# Patient Record
Sex: Male | Born: 1945 | Race: White | Hispanic: No | Smoking: Current every day smoker
Health system: Southern US, Community
[De-identification: ages and names within clinical notes are randomized; demographics above are authoritative.]

## PROBLEM LIST (undated history)

## (undated) DIAGNOSIS — E785 Hyperlipidemia, unspecified: Secondary | ICD-10-CM

## (undated) DIAGNOSIS — I255 Ischemic cardiomyopathy: Secondary | ICD-10-CM

## (undated) DIAGNOSIS — I10 Essential (primary) hypertension: Secondary | ICD-10-CM

## (undated) DIAGNOSIS — I2510 Atherosclerotic heart disease of native coronary artery without angina pectoris: Secondary | ICD-10-CM

## (undated) DIAGNOSIS — K219 Gastro-esophageal reflux disease without esophagitis: Secondary | ICD-10-CM

## (undated) DIAGNOSIS — I472 Ventricular tachycardia: Secondary | ICD-10-CM

## (undated) DIAGNOSIS — Z8673 Personal history of transient ischemic attack (TIA), and cerebral infarction without residual deficits: Secondary | ICD-10-CM

## (undated) HISTORY — DX: Gastro-esophageal reflux disease without esophagitis: K21.9

## (undated) HISTORY — PX: HIP SURGERY: SHX245

## (undated) HISTORY — DX: Personal history of transient ischemic attack (TIA), and cerebral infarction without residual deficits: Z86.73

## (undated) HISTORY — DX: Hyperlipidemia, unspecified: E78.5

---
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
## 1993-12-31 HISTORY — PX: CORONARY ARTERY BYPASS GRAFT: SHX141

## 2013-12-31 DIAGNOSIS — I639 Cerebral infarction, unspecified: Secondary | ICD-10-CM

## 2013-12-31 HISTORY — DX: Cerebral infarction, unspecified: I63.9

## 2018-12-11 ENCOUNTER — Ambulatory Visit
Admission: RE | Admit: 2018-12-11 | Discharge: 2018-12-11 | Disposition: A | Discharge status: Home / Self Care | Payer: Medicare Other | Source: Ambulatory Visit | Attending: Nurse Practitioner | Admitting: Nurse Practitioner

## 2018-12-11 ENCOUNTER — Other Ambulatory Visit: Payer: Self-pay | Admitting: Nurse Practitioner

## 2018-12-11 DIAGNOSIS — S42415A Nondisplaced simple supracondylar fracture without intercondylar fracture of left humerus, initial encounter for closed fracture: Secondary | ICD-10-CM

## 2019-01-15 ENCOUNTER — Other Ambulatory Visit: Payer: Self-pay | Admitting: Orthopedic Surgery

## 2019-01-15 DIAGNOSIS — M25512 Pain in left shoulder: Secondary | ICD-10-CM

## 2019-01-24 ENCOUNTER — Other Ambulatory Visit: Payer: Self-pay | Admitting: Cardiology

## 2019-01-24 DIAGNOSIS — I2510 Atherosclerotic heart disease of native coronary artery without angina pectoris: Secondary | ICD-10-CM

## 2019-01-24 DIAGNOSIS — R011 Cardiac murmur, unspecified: Secondary | ICD-10-CM

## 2019-01-27 ENCOUNTER — Ambulatory Visit
Admission: RE | Admit: 2019-01-27 | Discharge: 2019-01-27 | Disposition: A | Discharge status: Home / Self Care | Payer: Medicare Other | Source: Ambulatory Visit | Attending: Orthopedic Surgery | Admitting: Orthopedic Surgery

## 2019-01-27 DIAGNOSIS — M25512 Pain in left shoulder: Secondary | ICD-10-CM

## 2019-01-27 MED ORDER — IOPAMIDOL (ISOVUE-M 200) INJECTION 41%
15.0000 mL | Freq: Once | INTRAMUSCULAR | Status: AC
  Administered 2019-01-27: 15 mL via INTRA_ARTICULAR

## 2019-02-24 ENCOUNTER — Ambulatory Visit (INDEPENDENT_AMBULATORY_CARE_PROVIDER_SITE_OTHER): Payer: Medicare Other

## 2019-02-24 DIAGNOSIS — R011 Cardiac murmur, unspecified: Secondary | ICD-10-CM | POA: Diagnosis not present

## 2019-02-24 DIAGNOSIS — I2510 Atherosclerotic heart disease of native coronary artery without angina pectoris: Secondary | ICD-10-CM | POA: Diagnosis not present

## 2019-07-13 NOTE — Progress Notes (Deleted)
Virtual Visit via Video Note   Subjective:   Anthony Richmond, male    DOB: Jul 11, 1946, 73 y.o.   MRN: [id number]   I connected with the patient on 07/15/2019 by a video enabled telemedicine application and verified that I am speaking with the correct person using two identifiers.     I discussed the limitations of evaluation and management by telemedicine and the availability of in person appointments. The patient expressed understanding and agreed to proceed.   This visit type was conducted due to national recommendations for restrictions regarding the COVID-19 Pandemic (e.g. social distancing).  This format is felt to be most appropriate for this patient at this time.  All issues noted in this document were discussed and addressed.  No physical exam was performed (except for noted visual exam findings with Tele health visits).  The patient has consented to conduct a Tele health visit and understands insurance will be billed.     Chief complaint:  Coronary artery disease  HPI  73 y/o caucasian male with coronary arery disease s/p multiple prior stents, ischemic cardiomyopathy EF 35-40%, h/o stroke in 2015 with mild residual left sided deficit, hypertension, tobacco abuse.  Workup showed ischemic cardiomyopathy with LAD territory hypikinesis, EF 35-40%, grade II diastolic dysfunction. He also has mild AS, mod MR, mild TR.      *** No past medical history on file.  *** *** The histories are not reviewed yet. Please review them in the "History" navigator section and refresh this SmartLink.  *** Social History   Socioeconomic History  . Marital status: Divorced    Spouse name: Not on file  . Number of children: Not on file  . Years of education: Not on file  . Highest education level: Not on file  Occupational History  . Not on file  Social Needs  . Financial resource strain: Not on file  . Food insecurity    Worry: Not on file    Inability: Not on file  . Transportation  needs    Medical: Not on file    Non-medical: Not on file  Tobacco Use  . Smoking status: Not on file  Substance and Sexual Activity  . Alcohol use: Not on file  . Drug use: Not on file  . Sexual activity: Not on file  Lifestyle  . Physical activity    Days per week: Not on file    Minutes per session: Not on file  . Stress: Not on file  Relationships  . Social Herbalist on phone: Not on file    Gets together: Not on file    Attends religious service: Not on file    Active member of club or organization: Not on file    Attends meetings of clubs or organizations: Not on file    Relationship status: Not on file  . Intimate partner violence    Fear of current or ex partner: Not on file    Emotionally abused: Not on file    Physically abused: Not on file    Forced sexual activity: Not on file  Other Topics Concern  . Not on file  Social History Narrative  . Not on file    *** No family history on file.  *** No current outpatient medications on file prior to visit.   No current facility-administered medications on file prior to visit.     Cardiovascular studies:  ***  Echocardiogram 02/24/2019: Left ventricle cavity is normal in  size. Mild global and severe anterior/anteroseptal hypokinesis. Visual LVEF 35-40%.  Grade II diastolic dysfunction with elevated LAP.  Left atrial cavity is mildly dilated. Mild aortic stenosis. Moderate (Grade III) mitral regurgitation. Mild tricuspid regurgitation.  No evidence of pulmonary hypertension.  Cath 2016: NSTEMI PCI to SVG-PDA for severe ISR Resolute Integrity 3.5X38 mm & 3.5X22 mm DES, post dilated to 4.0 mm  Cath 2013 (Staged PCI on 06/25 & 06/28): PCI to [place] 2.25 X 12 mm DES LM Bifurcation stenting: Xience Expedition 2.5X12 mm, 2.5 X 15 mm, and 3.5 X 8 m DES, complicated by stent dislodgement and non-deployement in left main trunk. LM Xience Expedition 4.0 X 12 mm DES jailing the dislodged  stent against legt main trunk wall. Post dilatation with 2.5 kissing balloons, and 4.5 mm balloon in left main. PCI to [place] 3.5X 12 mm DES  Recent labs: 12/11/2018: Glucose 77/ BUN/Cr 9/0.74. eGFR normal.Na/K 129/4.6 H/H 13.8/40.1. MCV 100. Platelets 194 TSH normal  ROS      *** There were no vitals filed for this visit. (Measured by the patient using a home BP monitor)  There is no height or weight on file to calculate BMI. There were no vitals filed for this visit.  *** Observation/findings during video visit   Objective:    Physical Exam        Assessment & Recommendations:   73 y/o caucasian male with coronary arery disease s/p multiple prior stents, stroke in 2015 with mild residual left sided deficit, hypertension, tobacco abuse.   Coronary artery disease: Multiple prior PCI's. Currently, no angina symptoms. Reduce aspirin to 81 mg daily. Continue plavix in light of multiple prior PCI's and no active bleeding. Continue coreg, lisinopril. Switch pravastatin to crestor 20 mg daily. Obtain prior cardiac records. Given presence of murmur and known coronary artery disease history as above, will obtain baseline echocardiogram.  Hypertension: Fairly well controlled.  H/o CVA: Mild left sided neurodeficit. Obtain prior hospital records from 2015.  Tobacco cessation counseling:  Tobacco abuse with CAD  - Currently smoking 1/2 packs/day  - Patient was informed of the dangers of tobacco abuse including stroke, cancer, and MI, as well as benefits of tobacco cessation. - Patient is is willing to quit at this time. - 5 mins were spent counseling patient cessation techniques. We discussed various methods to help quit smoking, including deciding on a date to quit, joining a support group, pharmacological agents- nicotine gum/patch/lozenges, chantix. Patient would like to quit on his own. - I will reassess his progress at his next follow-up visit   I recommended heart healthy diet that includes more of lean meat, fish, fruits, vegetables, nuts, olives (Meditarranean diet) and less of processed food, red meat, dairy, cheese, fried food, sugar, excess salt. Recommend daily exercise with at least 30 min walking or 15 min high intensity exercise most days a week.  I will see him back in 6 months.  ***   [place], MD [facility] Pager: [phone] Office: [phone] If no answer Cell [phone]

## 2019-07-15 ENCOUNTER — Encounter: Payer: Self-pay | Admitting: Cardiology

## 2019-07-15 ENCOUNTER — Ambulatory Visit: Payer: Medicare Other | Admitting: Cardiology

## 2019-07-15 DIAGNOSIS — I255 Ischemic cardiomyopathy: Secondary | ICD-10-CM

## 2019-07-15 DIAGNOSIS — I10 Essential (primary) hypertension: Secondary | ICD-10-CM

## 2019-07-15 DIAGNOSIS — I472 Ventricular tachycardia: Secondary | ICD-10-CM

## 2019-07-15 DIAGNOSIS — E785 Hyperlipidemia, unspecified: Secondary | ICD-10-CM | POA: Insufficient documentation

## 2019-07-15 DIAGNOSIS — I4729 Other ventricular tachycardia: Secondary | ICD-10-CM

## 2019-07-15 DIAGNOSIS — K219 Gastro-esophageal reflux disease without esophagitis: Secondary | ICD-10-CM | POA: Insufficient documentation

## 2019-07-15 DIAGNOSIS — I2510 Atherosclerotic heart disease of native coronary artery without angina pectoris: Secondary | ICD-10-CM

## 2019-07-15 HISTORY — DX: Essential (primary) hypertension: I10

## 2019-07-15 HISTORY — DX: Ventricular tachycardia: I47.2

## 2019-07-15 HISTORY — DX: Atherosclerotic heart disease of native coronary artery without angina pectoris: I25.10

## 2019-07-15 HISTORY — DX: Ischemic cardiomyopathy: I25.5

## 2019-07-15 HISTORY — DX: Other ventricular tachycardia: I47.29

## 2019-08-19 ENCOUNTER — Ambulatory Visit: Payer: Medicare Other | Admitting: Cardiology

## 2019-10-21 DIAGNOSIS — I2510 Atherosclerotic heart disease of native coronary artery without angina pectoris: Secondary | ICD-10-CM | POA: Insufficient documentation

## 2019-10-21 DIAGNOSIS — I5020 Unspecified systolic (congestive) heart failure: Secondary | ICD-10-CM | POA: Insufficient documentation

## 2019-10-21 NOTE — Progress Notes (Signed)
Follow up visit  Subjective:   Anthony Richmond, male    DOB: 1946-02-11, 73 y.o.   MRN: [id number]   Chief Complaint  Patient presents with  . Coronary Artery Disease     HPI  73 y/o caucasian male with coronary arery disease s/p multiple prior stents, ischemic cardiomyopathy (EF 35-40%), stroke in 2015 with mild residual left sided deficit, hypertension, tobacco abuse.  Patient is here with his son today.  He has slight limitation with physical activity. He walks with a walker. He has minimal symptoms related to shortness of breath, leg edema.   Past Medical History:  Diagnosis Date  . Benign hypertension 07/15/2019  . Coronary artery disease without angina pectoris 07/15/2019  . GERD (gastroesophageal reflux disease)   . HLD (hyperlipidemia)   . Ischemic cardiomyopathy 07/15/2019  . NSVT (nonsustained ventricular tachycardia) (HCC) 07/15/2019  . Personal history of transient ischemic attack      History reviewed. No pertinent surgical history.   Social History   Socioeconomic History  . Marital status: Divorced    Spouse name: Not on file  . Number of children: 1  . Years of education: Not on file  . Highest education level: Not on file  Occupational History  . Not on file  Social Needs  . Financial resource strain: Not on file  . Food insecurity    Worry: Not on file    Inability: Not on file  . Transportation needs    Medical: Not on file    Non-medical: Not on file  Tobacco Use  . Smoking status: Current Every Day Smoker    Types: Cigarettes    Start date: 29  . Smokeless tobacco: Never Used  . Tobacco comment: Quit 1995 but started back  Substance and Sexual Activity  . Alcohol use: Yes    Comment: occ  . Drug use: Not Currently  . Sexual activity: Not on file  Lifestyle  . Physical activity    Days per week: Not on file    Minutes per session: Not on file  . Stress: Not on file  Relationships  . Social Musician on phone: Not on  file    Gets together: Not on file    Attends religious service: Not on file    Active member of club or organization: Not on file    Attends meetings of clubs or organizations: Not on file    Relationship status: Not on file  . Intimate partner violence    Fear of current or ex partner: Not on file    Emotionally abused: Not on file    Physically abused: Not on file    Forced sexual activity: Not on file  Other Topics Concern  . Not on file  Social History Narrative  . Not on file     Family History  Problem Relation Age of Onset  . Heart attack Father      Current Outpatient Medications on File Prior to Visit  Medication Sig Dispense Refill  . aspirin EC 81 MG tablet Take 325 mg by mouth daily.     . carvedilol (COREG) 6.25 MG tablet Take 6.25 mg by mouth 2 (two) times daily with a meal.    . clopidogrel (PLAVIX) 75 MG tablet Take 75 mg by mouth daily.    . mirtazapine (REMERON) 7.5 MG tablet Take 7.5 mg by mouth at bedtime.    [name] omeprazole (PRILOSEC) 40 MG capsule Take 40 mg by  mouth daily.    . pravastatin (PRAVACHOL) 40 MG tablet Take 40 mg by mouth daily.    . tamsulosin (FLOMAX) 0.4 MG CAPS capsule Take 0.4 mg by mouth daily.     No current facility-administered medications on file prior to visit.     Cardiovascular studies:  EKG 10/22/2019:  Sinus rhythm 80 bpm. Anteroseptal infarct, old. IVCD.   ST-T changes related to IVCD.   Echocardiogram 02/24/2019: Left ventricle cavity is normal in size. Mild global and severe anterior/anteroseptal hypokinesis. Visual LVEF 35-40%.  Grade II diastolic dysfunction with elevated LAP.  Left atrial cavity is mildly dilated. Mild aortic stenosis. Moderate (Grade III) mitral regurgitation. Mild tricuspid regurgitation.  No evidence of pulmonary hypertension.  Coronary angiography 2016: NSTEMI PCI to SVG-PDA for severe ISR Resolute Integrity 3.5X38 mm & 3.5X22 mm DES, post dilated to 4.0 mm  Staged PCI on 06/25 &  06/28: PCI to [place] 2.25 X 12 mm DES LM Bifurcation stenting: Xience Expedition 2.5X12 mm, 2.5 X 15 mm, and 3.5 X 8 m DES, complicated by stent dislodgement and non-deployement in left main trunk. LM Xience Expedition 4.0 X 12 mm DES jailing the dislodged stent against legt main trunk wall. Post dilatation with 2.5 kissing balloons, and 4.5 mm balloon in left main. PCI to [place] 3.5X 12 mm DES  Recent labs: Not available   Review of Systems  Constitution: Negative for decreased appetite, malaise/fatigue, weight gain and weight loss.  HENT: Negative for congestion.   Eyes: Negative for visual disturbance.  Cardiovascular: Positive for dyspnea on exertion (Mild). Negative for chest pain, leg swelling, palpitations and syncope.  Respiratory: Positive for shortness of breath. Negative for cough.   Endocrine: Negative for cold intolerance.  Hematologic/Lymphatic: Does not bruise/bleed easily.  Skin: Negative for itching and rash.  Musculoskeletal: Negative for myalgias.  Gastrointestinal: Negative for abdominal pain, nausea and vomiting.  Genitourinary: Negative for dysuria.  Neurological: Negative for dizziness and weakness.  Psychiatric/Behavioral: The patient is not nervous/anxious.   All other systems reviewed and are negative.        Vitals:   10/22/19 1027  BP: (!) 91/59  Pulse: 80  SpO2: 100%     Body mass index is 21.59 kg/m. Filed Weights   10/22/19 1027  Weight: 142 lb (64.4 kg)     Objective:   Physical Exam        Assessment & Recommendations:   73 y/o caucasian male with coronary arery disease s/p multiple prior stents, ischemic cardiomyopathy (EF 35-40%), stroke in 2015 with mild residual left sided deficit, hypertension, tobacco abuse  HFrEF: Ischemic cardiomyopathy, EF 35-40% (01/2019), likely causing moderate MR with cardiomyopathy.  Clinically compensated, NYHA class II symptoms. Blood pressure low normal  without any lightheadedness symptoms.  Will attempt stopping lisinopril today and challenge with Entresto 24-26 mg bid at next visit. Will check BMP in a week.  Repeat echocardiogram in 02/2020.  Coronary artery disease: Multiple prior PCI's. Currently, no angina symptoms. Conitnue aspirin to 81 mg daily. Continue plavix in light of multiple prior PCI's and no active bleeding. Continue coreg 6.25 mg bid. Continue crestor 20 mg daily.  H/o CVA: Mild left sided neurodeficit.  F/u in 2 weeks.    Nigel Mormon, MD [facility] Pager: [phone] Office: [phone] If no answer Cell [phone]

## 2019-10-22 ENCOUNTER — Other Ambulatory Visit: Payer: Self-pay

## 2019-10-22 ENCOUNTER — Encounter: Payer: Self-pay | Admitting: Cardiology

## 2019-10-22 ENCOUNTER — Ambulatory Visit (INDEPENDENT_AMBULATORY_CARE_PROVIDER_SITE_OTHER): Payer: Medicare Other | Admitting: Cardiology

## 2019-10-22 VITALS — BP 91/59 | HR 80 | Ht 68.0 in | Wt 142.0 lb

## 2019-10-22 DIAGNOSIS — I5020 Unspecified systolic (congestive) heart failure: Secondary | ICD-10-CM

## 2019-10-22 DIAGNOSIS — I2510 Atherosclerotic heart disease of native coronary artery without angina pectoris: Secondary | ICD-10-CM

## 2019-11-06 ENCOUNTER — Emergency Department (HOSPITAL_COMMUNITY): Payer: Medicare Other

## 2019-11-06 ENCOUNTER — Inpatient Hospital Stay (HOSPITAL_COMMUNITY)
Admission: EM | Admit: 2019-11-06 | Discharge: 2019-11-10 | DRG: 641 | Disposition: A | Discharge status: Home Health | Payer: Medicare Other | Attending: Internal Medicine | Admitting: Internal Medicine

## 2019-11-06 ENCOUNTER — Other Ambulatory Visit: Payer: Self-pay

## 2019-11-06 ENCOUNTER — Encounter (HOSPITAL_COMMUNITY): Payer: Self-pay

## 2019-11-06 DIAGNOSIS — N1831 Chronic kidney disease, stage 3a: Secondary | ICD-10-CM | POA: Diagnosis present

## 2019-11-06 DIAGNOSIS — E162 Hypoglycemia, unspecified: Secondary | ICD-10-CM | POA: Diagnosis present

## 2019-11-06 DIAGNOSIS — N179 Acute kidney failure, unspecified: Secondary | ICD-10-CM | POA: Diagnosis present

## 2019-11-06 DIAGNOSIS — E872 Acidosis, unspecified: Secondary | ICD-10-CM

## 2019-11-06 DIAGNOSIS — A419 Sepsis, unspecified organism: Secondary | ICD-10-CM | POA: Diagnosis present

## 2019-11-06 DIAGNOSIS — I959 Hypotension, unspecified: Secondary | ICD-10-CM | POA: Diagnosis present

## 2019-11-06 DIAGNOSIS — I130 Hypertensive heart and chronic kidney disease with heart failure and stage 1 through stage 4 chronic kidney disease, or unspecified chronic kidney disease: Secondary | ICD-10-CM | POA: Diagnosis present

## 2019-11-06 DIAGNOSIS — I10 Essential (primary) hypertension: Secondary | ICD-10-CM | POA: Diagnosis present

## 2019-11-06 DIAGNOSIS — R627 Adult failure to thrive: Secondary | ICD-10-CM | POA: Diagnosis present

## 2019-11-06 DIAGNOSIS — Z8249 Family history of ischemic heart disease and other diseases of the circulatory system: Secondary | ICD-10-CM | POA: Diagnosis not present

## 2019-11-06 DIAGNOSIS — Z20828 Contact with and (suspected) exposure to other viral communicable diseases: Secondary | ICD-10-CM | POA: Diagnosis present

## 2019-11-06 DIAGNOSIS — F329 Major depressive disorder, single episode, unspecified: Secondary | ICD-10-CM | POA: Diagnosis present

## 2019-11-06 DIAGNOSIS — R680 Hypothermia, not associated with low environmental temperature: Secondary | ICD-10-CM | POA: Diagnosis present

## 2019-11-06 DIAGNOSIS — E860 Dehydration: Secondary | ICD-10-CM | POA: Diagnosis not present

## 2019-11-06 DIAGNOSIS — E785 Hyperlipidemia, unspecified: Secondary | ICD-10-CM | POA: Diagnosis present

## 2019-11-06 DIAGNOSIS — Z8673 Personal history of transient ischemic attack (TIA), and cerebral infarction without residual deficits: Secondary | ICD-10-CM

## 2019-11-06 DIAGNOSIS — Z6821 Body mass index (BMI) 21.0-21.9, adult: Secondary | ICD-10-CM | POA: Diagnosis not present

## 2019-11-06 DIAGNOSIS — I454 Nonspecific intraventricular block: Secondary | ICD-10-CM | POA: Diagnosis present

## 2019-11-06 DIAGNOSIS — E876 Hypokalemia: Secondary | ICD-10-CM | POA: Diagnosis present

## 2019-11-06 DIAGNOSIS — Z7982 Long term (current) use of aspirin: Secondary | ICD-10-CM

## 2019-11-06 DIAGNOSIS — F17200 Nicotine dependence, unspecified, uncomplicated: Secondary | ICD-10-CM | POA: Diagnosis present

## 2019-11-06 DIAGNOSIS — K219 Gastro-esophageal reflux disease without esophagitis: Secondary | ICD-10-CM | POA: Diagnosis present

## 2019-11-06 DIAGNOSIS — F17210 Nicotine dependence, cigarettes, uncomplicated: Secondary | ICD-10-CM | POA: Diagnosis present

## 2019-11-06 DIAGNOSIS — I2510 Atherosclerotic heart disease of native coronary artery without angina pectoris: Secondary | ICD-10-CM | POA: Diagnosis present

## 2019-11-06 DIAGNOSIS — Z7902 Long term (current) use of antithrombotics/antiplatelets: Secondary | ICD-10-CM

## 2019-11-06 DIAGNOSIS — I255 Ischemic cardiomyopathy: Secondary | ICD-10-CM | POA: Diagnosis present

## 2019-11-06 DIAGNOSIS — I5022 Chronic systolic (congestive) heart failure: Secondary | ICD-10-CM | POA: Diagnosis present

## 2019-11-06 DIAGNOSIS — T68XXXA Hypothermia, initial encounter: Secondary | ICD-10-CM

## 2019-11-06 DIAGNOSIS — F32A Depression, unspecified: Secondary | ICD-10-CM | POA: Diagnosis present

## 2019-11-06 DIAGNOSIS — R42 Dizziness and giddiness: Secondary | ICD-10-CM | POA: Diagnosis present

## 2019-11-06 DIAGNOSIS — I5020 Unspecified systolic (congestive) heart failure: Secondary | ICD-10-CM | POA: Diagnosis present

## 2019-11-06 DIAGNOSIS — Z79899 Other long term (current) drug therapy: Secondary | ICD-10-CM

## 2019-11-06 HISTORY — DX: Dehydration: E86.0

## 2019-11-06 LAB — URINALYSIS, ROUTINE W REFLEX MICROSCOPIC
Bilirubin Urine: NEGATIVE
Glucose, UA: 50 mg/dL — AB
Ketones, ur: 5 mg/dL — AB
Leukocytes,Ua: NEGATIVE
Nitrite: NEGATIVE
Protein, ur: 30 mg/dL — AB
Specific Gravity, Urine: 1.004 — ABNORMAL LOW (ref 1.005–1.030)
pH: 7 (ref 5.0–8.0)

## 2019-11-06 LAB — CBC WITH DIFFERENTIAL/PLATELET
Abs Immature Granulocytes: 0.05 10*3/uL (ref 0.00–0.07)
Basophils Absolute: 0 10*3/uL (ref 0.0–0.1)
Basophils Relative: 0 %
Eosinophils Absolute: 0.1 10*3/uL (ref 0.0–0.5)
Eosinophils Relative: 2 %
HCT: 43.2 % (ref 39.0–52.0)
Hemoglobin: 14.8 g/dL (ref 13.0–17.0)
Immature Granulocytes: 1 %
Lymphocytes Relative: 11 %
Lymphs Abs: 0.7 10*3/uL (ref 0.7–4.0)
MCH: 34.3 pg — ABNORMAL HIGH (ref 26.0–34.0)
MCHC: 34.3 g/dL (ref 30.0–36.0)
MCV: 100 fL (ref 80.0–100.0)
Monocytes Absolute: 0.2 10*3/uL (ref 0.1–1.0)
Monocytes Relative: 4 %
Neutro Abs: 5.5 10*3/uL (ref 1.7–7.7)
Neutrophils Relative %: 82 %
Platelets: 135 10*3/uL — ABNORMAL LOW (ref 150–400)
RBC: 4.32 MIL/uL (ref 4.22–5.81)
RDW: 11.9 % (ref 11.5–15.5)
WBC: 6.6 10*3/uL (ref 4.0–10.5)
nRBC: 0 % (ref 0.0–0.2)

## 2019-11-06 LAB — CBG MONITORING, ED
Glucose-Capillary: 168 mg/dL — ABNORMAL HIGH (ref 70–99)
Glucose-Capillary: 149 mg/dL — ABNORMAL HIGH (ref 70–99)
Glucose-Capillary: 118 mg/dL — ABNORMAL HIGH (ref 70–99)
Glucose-Capillary: 112 mg/dL — ABNORMAL HIGH (ref 70–99)

## 2019-11-06 LAB — COMPREHENSIVE METABOLIC PANEL
ALT: 19 U/L (ref 0–44)
AST: 26 U/L (ref 15–41)
Albumin: 3.7 g/dL (ref 3.5–5.0)
Alkaline Phosphatase: 111 U/L (ref 38–126)
Anion gap: 16 — ABNORMAL HIGH (ref 5–15)
BUN: 13 mg/dL (ref 8–23)
CO2: 19 mmol/L — ABNORMAL LOW (ref 22–32)
Calcium: 8.9 mg/dL (ref 8.9–10.3)
Chloride: 97 mmol/L — ABNORMAL LOW (ref 98–111)
Creatinine, Ser: 1.79 mg/dL — ABNORMAL HIGH (ref 0.61–1.24)
GFR calc Af Amer: 43 mL/min — ABNORMAL LOW (ref >60)
GFR calc non Af Amer: 37 mL/min — ABNORMAL LOW (ref >60)
Glucose, Bld: 182 mg/dL — ABNORMAL HIGH (ref 70–99)
Potassium: 3.2 mmol/L — ABNORMAL LOW (ref 3.5–5.1)
Sodium: 132 mmol/L — ABNORMAL LOW (ref 135–145)
Total Bilirubin: 1.1 mg/dL (ref 0.3–1.2)
Total Protein: 7.1 g/dL (ref 6.5–8.1)

## 2019-11-06 LAB — LACTIC ACID, PLASMA
Lactic Acid, Venous: 3.3 mmol/L (ref 0.5–1.9)
Lactic Acid, Venous: 2.5 mmol/L (ref 0.5–1.9)
Lactic Acid, Venous: 1.7 mmol/L (ref 0.5–1.9)

## 2019-11-06 LAB — APTT: aPTT: 27 seconds (ref 24–36)

## 2019-11-06 LAB — PROTIME-INR
INR: 1 (ref 0.8–1.2)
Prothrombin Time: 13.3 seconds (ref 11.4–15.2)

## 2019-11-06 LAB — SARS CORONAVIRUS 2 (TAT 6-24 HRS): SARS Coronavirus 2: NEGATIVE

## 2019-11-06 LAB — TROPONIN I (HIGH SENSITIVITY): Troponin I (High Sensitivity): 14 ng/L (ref <18)

## 2019-11-06 LAB — PROCALCITONIN: Procalcitonin: <0.1 ng/mL

## 2019-11-06 MED ORDER — ASPIRIN EC 325 MG PO TBEC
325.0000 mg | DELAYED_RELEASE_TABLET | Freq: Every day | ORAL | Status: DC
  Administered 2019-11-06 – 2019-11-10 (×5): 325 mg via ORAL
  Filled 2019-11-06 (×5): qty 1

## 2019-11-06 MED ORDER — SODIUM CHLORIDE 0.9 % IV BOLUS (SEPSIS)
1000.0000 mL | Freq: Once | INTRAVENOUS | Status: AC
  Administered 2019-11-06: 07:00:00 1000 mL via INTRAVENOUS

## 2019-11-06 MED ORDER — DOCUSATE SODIUM 100 MG PO CAPS
100.0000 mg | ORAL_CAPSULE | Freq: Two times a day (BID) | ORAL | Status: DC
  Administered 2019-11-06 – 2019-11-10 (×9): 100 mg via ORAL
  Filled 2019-11-06 (×9): qty 1

## 2019-11-06 MED ORDER — SODIUM CHLORIDE 0.9% FLUSH
3.0000 mL | Freq: Two times a day (BID) | INTRAVENOUS | Status: DC
  Administered 2019-11-06 – 2019-11-09 (×6): 3 mL via INTRAVENOUS
  Administered 2019-11-09: 03:00:00 via INTRAVENOUS
  Administered 2019-11-10: 09:00:00 3 mL via INTRAVENOUS

## 2019-11-06 MED ORDER — ACETAMINOPHEN 325 MG PO TABS: 650.0000 mg | ORAL_TABLET | Freq: Four times a day (QID) | ORAL | Status: DC | PRN

## 2019-11-06 MED ORDER — LORAZEPAM 2 MG/ML IJ SOLN: 1.0000 mg | Freq: Once | INTRAMUSCULAR | Status: DC

## 2019-11-06 MED ORDER — SODIUM CHLORIDE 0.9 % IV SOLN
INTRAVENOUS | Status: DC
  Administered 2019-11-06 – 2019-11-07 (×3): via INTRAVENOUS

## 2019-11-06 MED ORDER — ONDANSETRON HCL 4 MG/2ML IJ SOLN: 4.0000 mg | Freq: Four times a day (QID) | INTRAMUSCULAR | Status: DC | PRN

## 2019-11-06 MED ORDER — CLOPIDOGREL BISULFATE 75 MG PO TABS
75.0000 mg | ORAL_TABLET | Freq: Every day | ORAL | Status: DC
  Administered 2019-11-06 – 2019-11-10 (×5): 75 mg via ORAL
  Filled 2019-11-06 (×5): qty 1

## 2019-11-06 MED ORDER — MIRTAZAPINE 15 MG PO TABS
15.0000 mg | ORAL_TABLET | Freq: Every day | ORAL | Status: DC
  Administered 2019-11-06 – 2019-11-09 (×4): 15 mg via ORAL
  Filled 2019-11-06 (×4): qty 1

## 2019-11-06 MED ORDER — PRAVASTATIN SODIUM 40 MG PO TABS
40.0000 mg | ORAL_TABLET | Freq: Every day | ORAL | Status: DC
  Administered 2019-11-06 – 2019-11-10 (×5): 40 mg via ORAL
  Filled 2019-11-06 (×5): qty 1

## 2019-11-06 MED ORDER — TAMSULOSIN HCL 0.4 MG PO CAPS
0.4000 mg | ORAL_CAPSULE | Freq: Every day | ORAL | Status: DC
  Administered 2019-11-06 – 2019-11-10 (×5): 0.4 mg via ORAL
  Filled 2019-11-06 (×5): qty 1

## 2019-11-06 MED ORDER — CARVEDILOL 3.125 MG PO TABS
3.1250 mg | ORAL_TABLET | Freq: Two times a day (BID) | ORAL | Status: DC
  Administered 2019-11-06 – 2019-11-10 (×8): 3.125 mg via ORAL
  Filled 2019-11-06 (×10): qty 1

## 2019-11-06 MED ORDER — SODIUM CHLORIDE 0.9 % IV SOLN
INTRAVENOUS | Status: DC
  Administered 2019-11-06: 08:00:00 via INTRAVENOUS

## 2019-11-06 MED ORDER — VANCOMYCIN HCL IN DEXTROSE 1-5 GM/200ML-% IV SOLN
1000.0000 mg | Freq: Once | INTRAVENOUS | Status: AC
  Administered 2019-11-06: 07:00:00 1000 mg via INTRAVENOUS
  Filled 2019-11-06: qty 200

## 2019-11-06 MED ORDER — ACETAMINOPHEN 650 MG RE SUPP: 650.0000 mg | Freq: Four times a day (QID) | RECTAL | Status: DC | PRN

## 2019-11-06 MED ORDER — ENOXAPARIN SODIUM 40 MG/0.4ML ~~LOC~~ SOLN
40.0000 mg | SUBCUTANEOUS | Status: DC
  Administered 2019-11-06 – 2019-11-09 (×4): 40 mg via SUBCUTANEOUS
  Filled 2019-11-06 (×4): qty 0.4

## 2019-11-06 MED ORDER — SODIUM CHLORIDE 0.9 % IV SOLN
INTRAVENOUS | Status: DC
  Administered 2019-11-06: 05:00:00 via INTRAVENOUS

## 2019-11-06 MED ORDER — PANTOPRAZOLE SODIUM 40 MG PO TBEC
40.0000 mg | DELAYED_RELEASE_TABLET | Freq: Every day | ORAL | Status: DC
  Administered 2019-11-06 – 2019-11-10 (×5): 40 mg via ORAL
  Filled 2019-11-06 (×5): qty 1

## 2019-11-06 MED ORDER — SODIUM CHLORIDE 0.9 % IV SOLN
2.0000 g | Freq: Once | INTRAVENOUS | Status: AC
  Administered 2019-11-06: 06:00:00 2 g via INTRAVENOUS
  Filled 2019-11-06: qty 2

## 2019-11-06 MED ORDER — SODIUM CHLORIDE 0.9 % IV BOLUS (SEPSIS)
1000.0000 mL | Freq: Once | INTRAVENOUS | Status: AC
  Administered 2019-11-06: 05:00:00 1000 mL via INTRAVENOUS

## 2019-11-06 MED ORDER — SODIUM CHLORIDE 0.9 % IV SOLN: 2.0000 g | Freq: Two times a day (BID) | INTRAVENOUS | Status: DC

## 2019-11-06 MED ORDER — VANCOMYCIN HCL IN DEXTROSE 750-5 MG/150ML-% IV SOLN: 750.0000 mg | INTRAVENOUS | Status: DC

## 2019-11-06 MED ORDER — ONDANSETRON HCL 4 MG PO TABS: 4.0000 mg | ORAL_TABLET | Freq: Four times a day (QID) | ORAL | Status: DC | PRN

## 2019-11-06 MED ORDER — METRONIDAZOLE IN NACL 5-0.79 MG/ML-% IV SOLN
500.0000 mg | Freq: Once | INTRAVENOUS | Status: AC
  Administered 2019-11-06: 06:00:00 500 mg via INTRAVENOUS
  Filled 2019-11-06: qty 100

## 2019-11-06 NOTE — ED Notes (Signed)
hospitalist repaged per Dr. Tera Helper request

## 2019-11-06 NOTE — ED Notes (Signed)
Heart Healthy Diet was ordered for Breakfast. 

## 2019-11-06 NOTE — H&P (Signed)
History and Physical    Anthony Richmond OZH:[id number] DOB: 10/24/1946 DOA: 11/06/2019  PCP: Georgann HousekeeperHusain, Karrar, MD Consultants:  Rosemary HolmsPatwardhan - cardiology Patient coming from:  [facility]; NOK: Omer JackSon, Dirk, [phone]  Chief Complaint: dizziness  HPI: Anthony Richmond is a 73 y.o. male with medical history significant of CVA; HLD; and HTN presenting with dizziness.  He reports that his BP was previously high, but lower during the last year.  He gets dizzy and feels like the "ground's just moving on me."  This morning, he started sweating and didn't feel well.  He called his son and his son called 911.  He did not eat lunch or dinner yesterday, poor PO intake since his CVA in 2015.  No URI symptoms, normal BMs, no GI symptoms.  He got up about 2AM to go to the bathroom and couldn't get back to sleep.  He does feel better now.  He has not been eating/drinking well.  He went to visit family in [place] over the summer but returned here and has been struggling.     ED Course:  Carryover, per Dr. Loney Lohathore:  Patient presented to the hospital via EMS from his independent nursing facility with complaints of lightheadedness. Found to be hypotensive with systolic in the 60s and hypoglycemic with CBG in the 40s by EMS. Improved after IV fluid and D50. Temperature 94.8 F and sepsis work-up initiated. No leukocytosis. Lactate 3.3. UA and chest x-ray negative. Creatinine 1.7, no prior labs for comparison. Fluids and antibiotics given. Covid test pending. Bed request placed as PUI.  Review of Systems: As per HPI; otherwise review of systems reviewed and negative.   Ambulatory Status:  Ambulates with a walker  Past Medical History:  Diagnosis Date  . Benign hypertension 07/15/2019  . Coronary artery disease without angina pectoris 07/15/2019  . CVA (cerebral vascular accident) (HCC) 2015  . GERD (gastroesophageal reflux disease)   . HLD (hyperlipidemia)   . Ischemic cardiomyopathy  07/15/2019  . NSVT (nonsustained ventricular tachycardia) (HCC) 07/15/2019  . Personal history of transient ischemic attack     History reviewed. No pertinent surgical history.  Social History   Socioeconomic History  . Marital status: Divorced    Spouse name: Not on file  . Number of children: 1  . Years of education: Not on file  . Highest education level: Not on file  Occupational History  . Occupation: retired  Engineer, productionocial Needs  . Financial resource strain: Not on file  . Food insecurity    Worry: Not on file    Inability: Not on file  . Transportation needs    Medical: Not on file    Non-medical: Not on file  Tobacco Use  . Smoking status: Current Every Day Smoker    Packs/day: 0.50    Years: 50.00    Pack years: 25.00    Types: Cigarettes    Start date: 481968  . Smokeless tobacco: Never Used  . Tobacco comment: Quit 1995 but started back  Substance and Sexual Activity  . Alcohol use: Yes    Comment: 2 drinks/day  . Drug use: Not Currently  . Sexual activity: Not on file  Lifestyle  . Physical activity    Days per week: Not on file    Minutes per session: Not on file  . Stress: Not on file  Relationships  . Social Musicianconnections    Talks on phone: Not on file    Gets together: Not on file  Attends religious service: Not on file    Active member of club or organization: Not on file    Attends meetings of clubs or organizations: Not on file    Relationship status: Not on file  . Intimate partner violence    Fear of current or ex partner: Not on file    Emotionally abused: Not on file    Physically abused: Not on file    Forced sexual activity: Not on file  Other Topics Concern  . Not on file  Social History Narrative  . Not on file    No Known Allergies  Family History  Problem Relation Age of Onset  . Heart attack Father     Prior to Admission medications   Medication Sig Start Date End Date Taking? Authorizing Provider  aspirin EC 81 MG tablet Take  325 mg by mouth daily.     [provider]  carvedilol (COREG) 6.25 MG tablet Take 6.25 mg by mouth 2 (two) times daily with a meal.    [provider]  clopidogrel (PLAVIX) 75 MG tablet Take 75 mg by mouth daily.    [provider]  mirtazapine (REMERON) 7.5 MG tablet Take 7.5 mg by mouth at bedtime.    [provider]  omeprazole (PRILOSEC) 40 MG capsule Take 40 mg by mouth daily.    [provider]  pravastatin (PRAVACHOL) 40 MG tablet Take 40 mg by mouth daily.    [provider]  tamsulosin (FLOMAX) 0.4 MG CAPS capsule Take 0.4 mg by mouth daily.    [provider]    Physical Exam: Vitals:   11/06/19 0800 11/06/19 0815 11/06/19 0830 11/06/19 0845  BP: 122/60 133/76 125/79 (!) 120/59  Pulse: 86 96 (!) 104 89  Resp: 19 20 (!) 25 18  Temp:      TempSrc:      SpO2: 98% 99% 100% 99%  Weight:      Height:         . General:  Appears calm and comfortable and is NAD . Eyes:  PERRL, EOMI, normal lids, iris . ENT:  grossly normal hearing, lips & tongue, mildly dry mm; appropriate dentition . Neck:  no LAD, masses or thyromegaly . Cardiovascular:  RRR, no m/r/g. No LE edema.  [name] Respiratory:   CTA bilaterally with no wheezes/rales/rhonchi.  Normal respiratory effort. . Abdomen:  soft, NT, ND, NABS . Skin:  Scattered maculopapular lesions on arms with excoriation from itching; scattered contusions on legs . Musculoskeletal:  grossly normal tone BUE/BLE, good ROM, no bony abnormality . Psychiatric:  depressed mood and affect, speech fluent and appropriate, AOx3 . Neurologic:  CN 2-12 grossly intact, moves all extremities in coordinated fashion, sensation intact    Radiological Exams on Admission: Dg Chest Port 1 View  Result Date: 11/06/2019 CLINICAL DATA:  Hypotension. EXAM: PORTABLE CHEST 1 VIEW COMPARISON:  None. FINDINGS: There is no large pleural effusion. The heart size is normal. The patient is status post prior  median sternotomy. A loop recorder is noted. There is no pneumothorax. IMPRESSION: No active disease. Electronically Signed   By: Katherine Mantle M.D.   On: 11/06/2019 04:50    EKG: Independently reviewed.  NSR with rate 85; IVCD; nonspecific ST changes with no evidence of acute ischemia   Labs on Admission: I have personally reviewed the available labs and imaging studies at the time of the admission.  Pertinent labs:   Na++ 132 K+ 3.2 CO2 19 Glucose 182  BUN 13/Creatinine 1.79/GFR 37 Anion gap 16 HS troponin 14 WBC 6.6 Platelets 135 UA: 50 glucose, small Hgb, 5 ketones, 30 protein, rare bacteria Lactate 3.3 -> 2.5 Blood and urine cultures pending   Assessment/Plan Principal Problem:   Failure to thrive in adult Active Problems:   Benign hypertension   HLD (hyperlipidemia)   HFrEF (heart failure with reduced ejection fraction) (HCC)   Coronary artery disease involving native coronary artery of native heart without angina pectoris   Depression   Tobacco dependence   Failure to thrive -Patient presenting with dizziness, hypotension, hypothermia - initial concern for sepsis -As the history and evaluation have evolved, it appears more likely that his presentation is more c/w dehydration and FTT -Hemodynamics have improved with IVF to date -He acknowledges having nothing to eat yesterday after breakfast and also has fairly significant depression -No apparent infectious etiology at this time -For now, awaiting procalcitonin; if <0.10, will stop antibiotics -Blood and urine cultures are pending -Will continue IVF and continue to trend lactate -Will admit for ongoing evaluation and telemetry monitoring  Renal dysfunction -Uncertain baseline creatinine -Will give IVF and continue to monitor  Depression -Patient reports that he has been "going downhill" since CVA 5 years ago -His son moved him from his home in [place] to [employer] about a year ago -The patient reports  depression with passive SI -He declines psychiatry consultation at this time -Will increase Remeron dose to 30 mg qhs and continue to monitor  HTN -Previously had 1 medication stopped in October but continues on Coreg -For now will decrease Coreg dose to 3.125 mg BID - likely still needs for CVD protection, but he also had hypotension on presentation (normalized with IVF)  HLD -Continue Pravachol  CAD/HFrEF -Continue ASA/Plavix -Coreg dose decreased but medication continued, as above -No ACE due to renal dysfunction and low BPs -Continue statin  Tobacco dependence -Encourage cessation.   -This was discussed with the patient and should be reviewed on an ongoing basis.   -Patch declined.    Note: This patient has been tested and is pending for the novel coronavirus COVID-19.  DVT prophylaxis:  Lovenox  Code Status:  Full - confirmed with patient Family Communication: None present; I spoke with his son by telephone  Disposition Plan:  Home once clinically improved Consults called: None  Admission status: Admit - It is my clinical opinion that admission to INPATIENT is reasonable and necessary because of the expectation that this patient will require hospital care that crosses at least 2 midnights to treat this condition based on the medical complexity of the problems presented.  Given the aforementioned information, the predictability of an adverse outcome is felt to be significant.    Karmen Bongo MD [facility]   How to contact the [facility] Attending or Consulting provider [place] or covering provider during after hours [place], for this patient?  1. Check the care team in [facility] and look for a) attending/consulting [facility] provider listed and b) the [facility] team listed 2. Log into [email or web address] and use [place]'s universal password to access. If you do not have the password, please contact the hospital operator. 3. Locate the [facility] provider you are looking for under [facility] and page to a number that you can be directly reached. 4. If you still have difficulty reaching the provider, please page the [facility] (Director on Call) for the Hospitalists listed on amion for assistance.   11/06/2019, 9:21 AM

## 2019-11-06 NOTE — ED Provider Notes (Signed)
TIME SEEN: 4:17 AM  CHIEF COMPLAINT: Hypotension, hypoglycemia  HPI: Patient is a 73 year old male with history of hypertension, CAD, hyperlipidemia who presents to the emergency department from his independent living facility with dizziness.  Found to be hypotensive with systolic blood pressures in the 60s and a blood sugar in the 40s.  Was given IV fluids and dextrose with EMS.  Patient states he is feeling better.  No recent fevers, cough, vomiting, diarrhea, chest pain, shortness of breath.  Recently taken off of lisinopril October 22 but is still on Coreg 6.25 mg twice daily.  No other blood pressure medications.  No history of diabetes.  Son reports he has not been eating and drinking well recently.  Denies bloody stools or melena.  ROS: See HPI Constitutional: no fever  Eyes: no drainage  ENT: no runny nose   Cardiovascular:  no chest pain  Resp: no SOB  GI: no vomiting GU: no dysuria Integumentary: no rash  Allergy: no hives  Musculoskeletal: no leg swelling  Neurological: no slurred speech ROS otherwise negative  PAST MEDICAL HISTORY/PAST SURGICAL HISTORY:  Past Medical History:  Diagnosis Date  . Benign hypertension 07/15/2019  . Coronary artery disease without angina pectoris 07/15/2019  . GERD (gastroesophageal reflux disease)   . HLD (hyperlipidemia)   . Ischemic cardiomyopathy 07/15/2019  . NSVT (nonsustained ventricular tachycardia) ([facility]) 07/15/2019  . Personal history of transient ischemic attack     MEDICATIONS:  Prior to Admission medications   Medication Sig Start Date End Date Taking? Authorizing Provider  aspirin EC 81 MG tablet Take 325 mg by mouth daily.     [provider]  carvedilol (COREG) 6.25 MG tablet Take 6.25 mg by mouth 2 (two) times daily with a meal.    [provider]  clopidogrel (PLAVIX) 75 MG tablet Take 75 mg by mouth daily.    [provider]  mirtazapine (REMERON) 7.5 MG tablet Take 7.5 mg by mouth at bedtime.     [provider]  omeprazole (PRILOSEC) 40 MG capsule Take 40 mg by mouth daily.    [provider]  pravastatin (PRAVACHOL) 40 MG tablet Take 40 mg by mouth daily.    [provider]  tamsulosin (FLOMAX) 0.4 MG CAPS capsule Take 0.4 mg by mouth daily.    [provider]    ALLERGIES:  No Known Allergies  SOCIAL HISTORY:  Social History   Tobacco Use  . Smoking status: Current Every Day Smoker    Types: Cigarettes    Start date: 15  . Smokeless tobacco: Never Used  . Tobacco comment: Quit 1995 but started back  Substance Use Topics  . Alcohol use: Yes    Comment: occ    FAMILY HISTORY: Family History  Problem Relation Age of Onset  . Heart attack Father     EXAM: BP 123/75   Pulse 82   Temp (!) 94.8 F (34.9 C) (Rectal)   Resp 18   Ht 5\' 9"  (1.753 m)   Wt 63.5 kg   SpO2 100%   BMI 20.67 kg/m  CONSTITUTIONAL: Alert and oriented x4 and responds appropriately to questions.  Elderly, in no distress HEAD: Normocephalic EYES: Conjunctivae clear, pupils appear equal, EOMI ENT: normal nose; moist mucous membranes NECK: Supple, no meningismus, no nuchal rigidity, no LAD  CARD: RRR; S1 and S2 appreciated; no murmurs, no clicks, no rubs, no gallops RESP: Normal chest excursion without splinting or tachypnea; breath sounds clear and equal bilaterally; no wheezes, no  rhonchi, no rales, no hypoxia or respiratory distress, speaking full sentences ABD/GI: Normal bowel sounds; non-distended; soft, non-tender, no rebound, no guarding, no peritoneal signs, no hepatosplenomegaly BACK:  The back appears normal and is non-tender to palpation, there is no CVA tenderness EXT: Normal ROM in all joints; non-tender to palpation; no edema; normal capillary refill; no cyanosis, no calf tenderness or swelling    SKIN: Normal color for age and race; warm; no rash NEURO: Moves all extremities equally, normal speech, no facial asymmetry PSYCH: [name]  mood and manner are appropriate. Grooming and personal hygiene are appropriate.  MEDICAL DECISION MAKING: Patient here with hypotension, hypoglycemia and also found to be hypothermic.  Patient reports he has not been eating and drinking well which could be causing his hypotension and hypoglycemia but I am also concerned for possible sepsis.  Will obtain labs, cultures, urine, chest x-ray, Covid swab.  Patient under a Lawyer currently.  Will continue IV fluids and broad-spectrum antibiotics.  Will monitor temperature and blood glucose closely.  Will allow patient to eat and drink.  EKG shows no ischemic abnormality currently.  He has not having chest pain or shortness of breath.  Anticipate admission to the hospital.  Patient and son comfortable with this plan.  ED PROGRESS: Patient's labs showed a lactic acidosis.  Also has creatinine of 1.79 with no old for comparison but no known history of kidney disease.  Lactate elevated at 3.3.  Chest x-ray clear.  Urine shows no sign of infection.  Troponin negative.  Covid pending.  Lactic acidosis, hypotension, hypoglycemia may be secondary to sepsis but also may be due to dehydration.  We will continue to hydrate patient.  His blood pressures have been stable here.  I recommended admission.  His blood sugars have trended down slowly but we have not yet needed to start a D10 infusion.  Will discuss with medicine for admission for further monitoring.   6:41 AM Discussed patient's case with hospitalist, Dr. Loney Loh.  I have recommended admission and patient (and family if present) agree with this plan. Admitting physician will place admission orders.   I reviewed all nursing notes, vitals, pertinent previous records and interpreted all EKGs, lab and urine results, imaging (as available).     EKG Interpretation  Date/Time:  Friday November 06 2019 04:41:32 EST Ventricular Rate:  85 PR Interval:    QRS Duration: 131 QT Interval:  436 QTC  Calculation: 519 R Axis:   89 Text Interpretation: Sinus rhythm Nonspecific intraventricular conduction delay Anterior infarct, old Borderline ST depression, diffuse leads No old tracing to compare Confirmed by Ward, Baxter Hire [phone]) on 11/06/2019 5:06:28 AM        CRITICAL CARE Performed by: Baxter Hire Ward   Total critical care time: 54 minutes  Critical care time was exclusive of separately billable procedures and treating other patients.  Critical care was necessary to treat or prevent imminent or life-threatening deterioration.  Critical care was time spent personally by me on the following activities: development of treatment plan with patient and/or surrogate as well as nursing, discussions with consultants, evaluation of patient's response to treatment, examination of patient, obtaining history from patient or surrogate, ordering and performing treatments and interventions, ordering and review of laboratory studies, ordering and review of radiographic studies, pulse oximetry and re-evaluation of patient's condition.   Anthony Richmond was evaluated in Emergency Department on 11/06/2019 for the symptoms described in the history of present illness. He was evaluated in the context of  the global COVID-19 pandemic, which necessitated consideration that the patient might be at risk for infection with the SARS-CoV-2 virus that causes COVID-19. Institutional protocols and algorithms that pertain to the evaluation of patients at risk for COVID-19 are in a state of rapid change based on information released by regulatory bodies including the CDC and federal and state organizations. These policies and algorithms were followed during the patient's care in the ED.    Ward, Layla MawKristen N, DO 11/06/19 [phone]

## 2019-11-06 NOTE — Progress Notes (Signed)
Pharmacy Antibiotic Note  Anthony Richmond is a 73 y.o. male admitted on 11/06/2019 with rule out sepsis.  Pharmacy has been consulted for Vancomycin/Cefepime dosing. WBC WNL. Noted renal dysfunction.   Plan: Vancomycin 750 mg IV q24h >>Estimated AUC: 517 Cefepime 2g IV q12h Trend WBC, temp, renal function  F/U infectious work-up Drug levels as indicated   Height: 5\' 9"  (175.3 cm) Weight: 140 lb (63.5 kg) IBW/kg (Calculated) : 70.7  Temp (24hrs), Avg:96.1 F (35.6 C), Min:94.8 F (34.9 C), Max:97.4 F (36.3 C)  Recent Labs  Lab 11/06/19 0420  WBC 6.6  CREATININE 1.79*    Estimated Creatinine Clearance: 33.5 mL/min (A) (by C-G formula based on SCr of 1.79 mg/dL (H)).    No Known Allergies  Narda Bonds, PharmD, BCPS Clinical Pharmacist Phone: [phone]

## 2019-11-06 NOTE — ED Triage Notes (Signed)
Pt brought to ED via [facility] from [facility]. Pts son called EMS after discovering pt was weak and still hadn't eaten after 2 weeks of weakness and poor PO intake. EMS reports BP of 61/33 and CBG 46 upon arrival. Pt off lisinopril since 10/22, currently on Coreg 6.25 mg BID. Pt is currently A&O x4. Rectal temp 94.8, bair-hugger applied.

## 2019-11-06 NOTE — ED Notes (Signed)
ED TO INPATIENT HANDOFF REPORT  ED Nurse Name and Phone #:  Mickeal Skinner [phone]  S Name/Age/Gender Anthony Richmond 73 y.o. male Room/Bed: 029C/029C  Code Status   Code Status: Full Code  Home/SNF/Other Nursing Home Patient oriented to: self, place, time and situation Is this baseline? Yes   Triage Complete: Triage complete  Chief Complaint hypotension  Triage Note Pt brought to ED via [facility] from [facility]. Pts son called EMS after discovering pt was weak and still hadn't eaten after 2 weeks of weakness and poor PO intake. EMS reports BP of 61/33 and CBG 46 upon arrival. Pt off lisinopril since 10/22, currently on Coreg 6.25 mg BID. Pt is currently A&O x4. Rectal temp 94.8, bair-hugger applied.   Allergies No Known Allergies  Level of Care/Admitting Diagnosis ED Disposition    ED Disposition Condition Comment   Admit  Hospital Area: [facility] [[id number]]  Level of Care: Telemetry Medical [104]  Covid Evaluation: Asymptomatic Screening Protocol (No Symptoms)  Diagnosis: Failure to thrive in adult [358490]  Admitting Physician: John Giovanni [[id number]]  Attending Physician: John Giovanni [[id number]]  Estimated length of stay: past midnight tomorrow  Certification:: I certify this patient will need inpatient services for at least 2 midnights  PT Class (Do Not Modify): Inpatient [101]  PT Acc Code (Do Not Modify): Private [1]       B Medical/Surgery History Past Medical History:  Diagnosis Date  . Benign hypertension 07/15/2019  . Coronary artery disease without angina pectoris 07/15/2019  . CVA (cerebral vascular accident) ([facility]) 2015  . GERD (gastroesophageal reflux disease)   . HLD (hyperlipidemia)   . Ischemic cardiomyopathy 07/15/2019  . NSVT (nonsustained ventricular tachycardia) ([facility]) 07/15/2019  . Personal history of transient ischemic attack    History reviewed. No pertinent surgical history.   A IV Location/Drains/Wounds Patient  Lines/Drains/Airways Status   Active Line/Drains/Airways    Name:   Placement date:   Placement time:   Site:   Days:   Peripheral IV 11/06/19 Left Forearm   11/06/19    0400    Forearm   less than 1   Peripheral IV 11/06/19 Right Forearm   11/06/19    0400    Forearm   less than 1          Intake/Output Last 24 hours  Intake/Output Summary (Last 24 hours) at 11/06/2019 1633 Last data filed at 11/06/2019 1410 Gross per 24 hour  Intake 2820 ml  Output 400 ml  Net 2420 ml    Labs/Imaging Results for orders placed or performed during the hospital encounter of 11/06/19 (from the past 48 hour(s))  CBG monitoring, ED     Status: Abnormal   Collection Time: 11/06/19  4:18 AM  Result Value Ref Range   Glucose-Capillary 168 (H) 70 - 99 mg/dL  Comprehensive metabolic panel     Status: Abnormal   Collection Time: 11/06/19  4:20 AM  Result Value Ref Range   Sodium 132 (L) 135 - 145 mmol/L   Potassium 3.2 (L) 3.5 - 5.1 mmol/L   Chloride 97 (L) 98 - 111 mmol/L   CO2 19 (L) 22 - 32 mmol/L   Glucose, Bld 182 (H) 70 - 99 mg/dL   BUN 13 8 - 23 mg/dL   Creatinine, Ser 7.09 (H) 0.61 - 1.24 mg/dL   Calcium 8.9 8.9 - 62.8 mg/dL   Total Protein 7.1 6.5 - 8.1 g/dL   Albumin 3.7 3.5 - 5.0 g/dL   AST  26 15 - 41 U/L   ALT 19 0 - 44 U/L   Alkaline Phosphatase 111 38 - 126 U/L   Total Bilirubin 1.1 0.3 - 1.2 mg/dL   GFR calc non Af Amer 37 (L) >60 mL/min   GFR calc Af Amer 43 (L) >60 mL/min   Anion gap 16 (H) 5 - 15    Comment: Performed at [facility] [address] [place], [place] [address]  CBC WITH DIFFERENTIAL     Status: Abnormal   Collection Time: 11/06/19  4:20 AM  Result Value Ref Range   WBC 6.6 4.0 - 10.5 K/uL   RBC 4.32 4.22 - 5.81 MIL/uL   Hemoglobin 14.8 13.0 - 17.0 g/dL   HCT 43.2 39.0 - 52.0 %   MCV 100.0 80.0 - 100.0 fL   MCH 34.3 (H) 26.0 - 34.0 pg   MCHC 34.3 30.0 - 36.0 g/dL   RDW 11.9 11.5 - 15.5 %   Platelets 135 (L) 150 - 400 K/uL   nRBC 0.0 0.0 - 0.2 %    Neutrophils Relative % 82 %   Neutro Abs 5.5 1.7 - 7.7 K/uL   Lymphocytes Relative 11 %   Lymphs Abs 0.7 0.7 - 4.0 K/uL   Monocytes Relative 4 %   Monocytes Absolute 0.2 0.1 - 1.0 K/uL   Eosinophils Relative 2 %   Eosinophils Absolute 0.1 0.0 - 0.5 K/uL   Basophils Relative 0 %   Basophils Absolute 0.0 0.0 - 0.1 K/uL   Immature Granulocytes 1 %   Abs Immature Granulocytes 0.05 0.00 - 0.07 K/uL    Comment: Performed at [facility] [address], [place], [place] [address]  APTT     Status: None   Collection Time: 11/06/19  4:20 AM  Result Value Ref Range   aPTT 27 24 - 36 seconds    Comment: Performed at [facility] [address] [place], [place] [address]  Protime-INR     Status: None   Collection Time: 11/06/19  4:20 AM  Result Value Ref Range   Prothrombin Time 13.3 11.4 - 15.2 seconds   INR 1.0 0.8 - 1.2    Comment: (NOTE) INR goal varies based on device and disease states. Performed at [facility], [place] [address], [place],  [address]   Urinalysis, Routine w reflex microscopic     Status: Abnormal   Collection Time: 11/06/19  4:20 AM  Result Value Ref Range   Color, Urine STRAW (A) YELLOW   APPearance CLEAR CLEAR   Specific Gravity, Urine 1.004 (L) 1.005 - 1.030   pH 7.0 5.0 - 8.0   Glucose, UA 50 (A) NEGATIVE mg/dL   Hgb urine dipstick SMALL (A) NEGATIVE   Bilirubin Urine NEGATIVE NEGATIVE   Ketones, ur 5 (A) NEGATIVE mg/dL   Protein, ur 30 (A) NEGATIVE mg/dL   Nitrite NEGATIVE NEGATIVE   Leukocytes,Ua NEGATIVE NEGATIVE   RBC / HPF 0-5 0 - 5 RBC/hpf   WBC, UA 0-5 0 - 5 WBC/hpf   Bacteria, UA RARE (A) NONE SEEN   Squamous Epithelial / LPF 0-5 0 - 5    Comment: Performed at [facility], [place] [address], [place], [place] [address]  Troponin I (High Sensitivity)     Status: None   Collection Time: 11/06/19  4:20 AM  Result Value Ref Range   Troponin I (High Sensitivity) 14 <18 ng/L    Comment: (NOTE) Elevated high  sensitivity  troponin I (hsTnI) values and significant  changes across serial measurements may suggest ACS but many other  chronic and acute conditions are known to elevate hsTnI results.  Refer to the "Links" section for chest pain algorithms and additional  guidance. Performed at [facility], [address], [place], [place] [address]   Procalcitonin - Baseline     Status: None   Collection Time: 11/06/19  4:20 AM  Result Value Ref Range   Procalcitonin <0.10 ng/mL    Comment:        Interpretation: PCT (Procalcitonin) <= 0.5 ng/mL: Systemic infection (sepsis) is not likely. Local bacterial infection is possible. (NOTE)       Sepsis PCT Algorithm           Lower Respiratory Tract                                      Infection PCT Algorithm    ----------------------------     ----------------------------         PCT < 0.25 ng/mL                PCT < 0.10 ng/mL         Strongly encourage             Strongly discourage   discontinuation of antibiotics    initiation of antibiotics    ----------------------------     -----------------------------       PCT 0.25 - 0.50 ng/mL            PCT 0.10 - 0.25 ng/mL               OR       >80% decrease in PCT            Discourage initiation of                                            antibiotics      Encourage discontinuation           of antibiotics    ----------------------------     -----------------------------         PCT >= 0.50 ng/mL              PCT 0.26 - 0.50 ng/mL               AND        <80% decrease in PCT             Encourage initiation of                                             antibiotics       Encourage continuation           of antibiotics    ----------------------------     -----------------------------        PCT >= 0.50 ng/mL                  PCT > 0.50 ng/mL               AND         increase  in PCT                  Strongly encourage                                      initiation of antibiotics    Strongly  encourage escalation           of antibiotics                                     -----------------------------                                           PCT <= 0.25 ng/mL                                                 OR                                        > 80% decrease in PCT                                     Discontinue / Do not initiate                                             antibiotics Performed at [facility], [address], [place], [place] [address]   Lactic acid, plasma     Status: Abnormal   Collection Time: 11/06/19  5:05 AM  Result Value Ref Range   Lactic Acid, Venous 3.3 (HH) 0.5 - 1.9 mmol/L    Comment: CRITICAL RESULT CALLED TO, READ BACK BY AND VERIFIED WITH: JASPER A,RN 11/06/19 0556 WAYK Performed at [facility], [address], [place], [place] [address]   CBG monitoring, ED     Status: Abnormal   Collection Time: 11/06/19  5:11 AM  Result Value Ref Range   Glucose-Capillary 149 (H) 70 - 99 mg/dL  SARS CORONAVIRUS 2 (TAT 6-24 HRS) Nasopharyngeal Nasopharyngeal Swab     Status: None   Collection Time: 11/06/19  5:58 AM   Specimen: Nasopharyngeal Swab  Result Value Ref Range   SARS Coronavirus 2 NEGATIVE NEGATIVE    Comment: (NOTE) SARS-CoV-2 target nucleic acids are NOT DETECTED. The SARS-CoV-2 RNA is generally detectable in upper and lower respiratory specimens during the acute phase of infection. Negative results do not preclude SARS-CoV-2 infection, do not rule out co-infections with other pathogens, and should not be used as the sole basis for treatment or other patient management decisions. Negative results must be combined with clinical observations, patient history, and epidemiological information. The expected result is Negative. Fact Sheet for Patients: HairSlick.no Fact Sheet for Healthcare Providers: quierodirigir.com This test is not yet approved or cleared by the  [place] FDA  and  has been authorized for detection and/or diagnosis of SARS-CoV-2 by FDA under an Emergency Use Authorization (EUA). This EUA will remain  in effect (meaning this test can be used) for the duration of the COVID-19 declaration under Section 56 4(b)(1) of the Act, 21 U.S.C. section 360bbb-3(b)(1), unless the authorization is terminated or revoked sooner. Performed at [facility], [address] [place], [place] [address]   CBG monitoring, ED     Status: Abnormal   Collection Time: 11/06/19  6:02 AM  Result Value Ref Range   Glucose-Capillary 118 (H) 70 - 99 mg/dL  CBG monitoring, ED     Status: Abnormal   Collection Time: 11/06/19  7:03 AM  Result Value Ref Range   Glucose-Capillary 112 (H) 70 - 99 mg/dL  Lactic acid, plasma     Status: Abnormal   Collection Time: 11/06/19  7:30 AM  Result Value Ref Range   Lactic Acid, Venous 2.5 (HH) 0.5 - 1.9 mmol/L    Comment: CRITICAL VALUE NOTED.  VALUE IS CONSISTENT WITH PREVIOUSLY REPORTED AND CALLED VALUE. Performed at [facility], [address] [place], [place] [address]    Dg Chest Port 1 View  Result Date: 11/06/2019 CLINICAL DATA:  Hypotension. EXAM: PORTABLE CHEST 1 VIEW COMPARISON:  None. FINDINGS: There is no large pleural effusion. The heart size is normal. The patient is status post prior median sternotomy. A loop recorder is noted. There is no pneumothorax. IMPRESSION: No active disease. Electronically Signed   By: Katherine Mantlehristopher  Green M.D.   On: 11/06/2019 04:50    Pending Labs Unresulted Labs (From admission, onward)    Start     Ordered   11/07/19 0500  Basic metabolic panel  Tomorrow morning,   R     11/06/19 0825   11/07/19 0500  CBC  Tomorrow morning,   R     11/06/19 0825   11/07/19 0500  Procalcitonin  Daily,   R     11/06/19 0825   11/06/19 0420  Blood Culture (routine x 2)  BLOOD CULTURE X 2,   STAT     11/06/19 0420   11/06/19 0420  Urine culture  ONCE - STAT,   STAT      11/06/19 0420          Vitals/Pain Today's Vitals   11/06/19 1415 11/06/19 1430 11/06/19 1445 11/06/19 1615  BP: (!) 145/70 (!) 123/54 131/62 130/68  Pulse: 85 83 85 80  Resp: 17 13 16 18   Temp:      TempSrc:      SpO2: 100% 100% 100% 99%  Weight:      Height:      PainSc:        Isolation Precautions No active isolations  Medications Medications  vancomycin (VANCOCIN) IVPB 750 mg/150 ml premix (has no administration in time range)  ceFEPIme (MAXIPIME) 2 g in sodium chloride 0.9 % 100 mL IVPB (has no administration in time range)  aspirin EC tablet 325 mg (325 mg Oral Given 11/06/19 1045)  carvedilol (COREG) tablet 3.125 mg (3.125 mg Oral Given 11/06/19 1626)  pravastatin (PRAVACHOL) tablet 40 mg (40 mg Oral Given 11/06/19 1045)  mirtazapine (REMERON) tablet 15 mg (has no administration in time range)  pantoprazole (PROTONIX) EC tablet 40 mg (40 mg Oral Given 11/06/19 1045)  tamsulosin (FLOMAX) capsule 0.4 mg (0.4 mg Oral Given 11/06/19 1045)  clopidogrel (PLAVIX) tablet 75 mg (75 mg Oral Given 11/06/19 1045)  enoxaparin (LOVENOX)  injection 40 mg (has no administration in time range)  acetaminophen (TYLENOL) tablet 650 mg (has no administration in time range)    Or  acetaminophen (TYLENOL) suppository 650 mg (has no administration in time range)  docusate sodium (COLACE) capsule 100 mg (100 mg Oral Given 11/06/19 1045)  ondansetron (ZOFRAN) tablet 4 mg (has no administration in time range)    Or  ondansetron (ZOFRAN) injection 4 mg (has no administration in time range)  sodium chloride flush (NS) 0.9 % injection 3 mL (3 mLs Intravenous Given 11/06/19 1630)  0.9 %  sodium chloride infusion ( Intravenous New Bag/Given 11/06/19 0941)  sodium chloride 0.9 % bolus 1,000 mL (0 mLs Intravenous Stopped 11/06/19 0747)    And  sodium chloride 0.9 % bolus 1,000 mL (0 mLs Intravenous Stopped 11/06/19 0748)  ceFEPIme (MAXIPIME) 2 g in sodium chloride 0.9 % 100 mL IVPB (0 g Intravenous Stopped  11/06/19 0634)  metroNIDAZOLE (FLAGYL) IVPB 500 mg (0 mg Intravenous Stopped 11/06/19 0638)  vancomycin (VANCOCIN) IVPB 1000 mg/200 mL premix (0 mg Intravenous Stopped 11/06/19 0849)    Mobility walks with device High fall risk   Focused Assessments Neuro Assessment Handoff:  Swallow screen pass? Yes  Cardiac Rhythm: Normal sinus rhythm NIH Stroke Scale ( + Modified Stroke Scale Criteria)  Interval: Initial Level of Consciousness (1a.)   : Alert, keenly responsive LOC Questions (1b. )   +: Answers both questions correctly LOC Commands (1c. )   + : Performs both tasks correctly Best Gaze (2. )  +: Normal Visual (3. )  +: No visual loss Facial Palsy (4. )    : Normal symmetrical movements Motor Arm, Left (5a. )   +: No drift Motor Arm, Right (5b. )   +: No drift Motor Leg, Left (6a. )   +: No drift Motor Leg, Right (6b. )   +: No drift Limb Ataxia (7. ): Absent Sensory (8. )   +: Normal, no sensory loss Best Language (9. )   +: No aphasia Dysarthria (10. ): Normal Extinction/Inattention (11.)   +: No Abnormality Modified SS Total  +: 0 Complete NIHSS TOTAL: 0     Neuro Assessment: Within Defined Limits Neuro Checks:   Initial (11/06/19 1631)  Last Documented NIHSS Modified Score: 0 (11/06/19 1631) Has TPA been given? No If patient is a Neuro Trauma and patient is going to OR before floor call report to 4N Charge nurse: [phone] or [phone]     R Recommendations: See Admitting Provider Note  Report given to:   Additional Notes:

## 2019-11-06 NOTE — ED Notes (Signed)
Anthony Richmond (son), left cell # for contact - [phone]

## 2019-11-07 LAB — CBC
HCT: 38.7 % — ABNORMAL LOW (ref 39.0–52.0)
Hemoglobin: 13.5 g/dL (ref 13.0–17.0)
MCH: 34.1 pg — ABNORMAL HIGH (ref 26.0–34.0)
MCHC: 34.9 g/dL (ref 30.0–36.0)
MCV: 97.7 fL (ref 80.0–100.0)
Platelets: 137 10*3/uL — ABNORMAL LOW (ref 150–400)
RBC: 3.96 MIL/uL — ABNORMAL LOW (ref 4.22–5.81)
RDW: 11.9 % (ref 11.5–15.5)
WBC: 6.7 10*3/uL (ref 4.0–10.5)
nRBC: 0 % (ref 0.0–0.2)

## 2019-11-07 LAB — BASIC METABOLIC PANEL
Anion gap: 10 (ref 5–15)
BUN: 14 mg/dL (ref 8–23)
CO2: 21 mmol/L — ABNORMAL LOW (ref 22–32)
Calcium: 8.7 mg/dL — ABNORMAL LOW (ref 8.9–10.3)
Chloride: 105 mmol/L (ref 98–111)
Creatinine, Ser: 1.66 mg/dL — ABNORMAL HIGH (ref 0.61–1.24)
GFR calc Af Amer: 47 mL/min — ABNORMAL LOW (ref >60)
GFR calc non Af Amer: 41 mL/min — ABNORMAL LOW (ref >60)
Glucose, Bld: 84 mg/dL (ref 70–99)
Potassium: 2.3 mmol/L — CL (ref 3.5–5.1)
Sodium: 136 mmol/L (ref 135–145)

## 2019-11-07 LAB — URINE CULTURE: Culture: NO GROWTH

## 2019-11-07 LAB — PROCALCITONIN: Procalcitonin: 0.21 ng/mL

## 2019-11-07 LAB — MAGNESIUM: Magnesium: 1.5 mg/dL — ABNORMAL LOW (ref 1.7–2.4)

## 2019-11-07 MED ORDER — POTASSIUM CHLORIDE 10 MEQ/100ML IV SOLN
10.0000 meq | INTRAVENOUS | Status: AC
  Administered 2019-11-07 (×6): 10 meq via INTRAVENOUS
  Filled 2019-11-07 (×6): qty 100

## 2019-11-07 MED ORDER — ADULT MULTIVITAMIN W/MINERALS CH
1.0000 | ORAL_TABLET | Freq: Every day | ORAL | Status: DC
  Administered 2019-11-07 – 2019-11-10 (×4): 1 via ORAL
  Filled 2019-11-07 (×4): qty 1

## 2019-11-07 MED ORDER — MAGNESIUM SULFATE 2 GM/50ML IV SOLN
2.0000 g | Freq: Once | INTRAVENOUS | Status: AC
  Administered 2019-11-07: 13:00:00 2 g via INTRAVENOUS
  Filled 2019-11-07: qty 50

## 2019-11-07 MED ORDER — POTASSIUM CHLORIDE CRYS ER 20 MEQ PO TBCR
40.0000 meq | EXTENDED_RELEASE_TABLET | Freq: Once | ORAL | Status: AC
  Administered 2019-11-07: 08:00:00 40 meq via ORAL
  Filled 2019-11-07: qty 2

## 2019-11-07 MED ORDER — ENSURE ENLIVE PO LIQD
237.0000 mL | Freq: Two times a day (BID) | ORAL | Status: DC
  Administered 2019-11-07 – 2019-11-10 (×6): 237 mL via ORAL
  Filled 2019-11-07: qty 237

## 2019-11-07 NOTE — Evaluation (Signed)
Physical Therapy Evaluation Patient Details Name: Anthony Richmond MRN: [id number] DOB: 10-09-1946 Today's Date: 11/07/2019   History of Present Illness  73 y.o. male with medical history significant of CVA; HLD; and HTN presenting with dizziness, hypotension, and hypothermia. Admitted for failure to thrive and dehydration.    Clinical Impression  Pt admitted with above diagnosis. PTA pt resided in ILF at [facility]. He ambulated with rollator vs cane. On eval, he required min guard assist transfers and min guard assist ambulation 200' with RW. Pt spoke very openly about his desire to move back to [place]. He moved here after his stroke at his son's request. He reports having 2 sisters in [place] that are able to provide housing and help with his care. Pt currently with functional limitations due to the deficits listed below (see PT Problem List). Pt will benefit from skilled PT to increase their independence and safety with mobility to allow discharge to the venue listed below.       Follow Up Recommendations Home health PT;Supervision - Intermittent    Equipment Recommendations  None recommended by PT    Recommendations for Other Services       Precautions / Restrictions Precautions Precautions: Fall      Mobility  Bed Mobility Overal bed mobility: Modified Independent             General bed mobility comments: HOB elevated. +rail. Increased time.  Transfers Overall transfer level: Needs assistance Equipment used: Rolling walker (2 wheeled) Transfers: Sit to/from Stand Sit to Stand: Min guard         General transfer comment: min guard for safety. No physical assist.  Ambulation/Gait Ambulation/Gait assistance: Min guard Gait Distance (Feet): 200 Feet Assistive device: Rolling walker (2 wheeled) Gait Pattern/deviations: Step-through pattern;Decreased stride length Gait velocity: decreased Gait velocity interpretation: 1.31 - 2.62 ft/sec, indicative of limited community  ambulator General Gait Details: min guard for safety, steady gait  Stairs            Wheelchair Mobility    Modified Rankin (Stroke Patients Only)       Balance Overall balance assessment: Needs assistance Sitting-balance support: No upper extremity supported;Feet supported Sitting balance-Leahy Scale: Good     Standing balance support: No upper extremity supported;During functional activity Standing balance-Leahy Scale: Fair Standing balance comment: static stand without AD support. RW for hallway ambulation.                             Pertinent Vitals/Pain Pain Assessment: No/denies pain    Home Living Family/patient expects to be discharged to:: Private residence Living Arrangements: Alone Available Help at Discharge: Family;Available PRN/intermittently Type of Home: Independent living facility Home Access: Elevator     Home Layout: One level Home Equipment: Cane - single point;Shower seat;Walker - 4 wheels;Grab bars - toilet;Grab bars - tub/shower      Prior Function Level of Independence: Independent with assistive device(s)         Comments: amb with rollator vs cane     Hand Dominance        Extremity/Trunk Assessment   Upper Extremity Assessment Upper Extremity Assessment: Defer to OT evaluation    Lower Extremity Assessment Lower Extremity Assessment: Generalized weakness    Cervical / Trunk Assessment Cervical / Trunk Assessment: Kyphotic  Communication   Communication: No difficulties  Cognition Arousal/Alertness: Awake/alert Behavior During Therapy: WFL for tasks assessed/performed Overall Cognitive Status: Within Functional Limits for tasks  assessed                                        General Comments      Exercises     Assessment/Plan    PT Assessment Patient needs continued PT services  PT Problem List Decreased strength;Decreased mobility;Decreased activity tolerance;Decreased  balance       PT Treatment Interventions Therapeutic activities;Gait training;Therapeutic exercise;Patient/family education;Balance training;Functional mobility training    PT Goals (Current goals can be found in the Care Plan section)  Acute Rehab PT Goals Patient Stated Goal: move back to [place] PT Goal Formulation: With patient Time For Goal Achievement: 11/21/19 Potential to Achieve Goals: Good    Frequency Min 3X/week   Barriers to discharge        Co-evaluation               AM-PAC PT "6 Clicks" Mobility  Outcome Measure Help needed turning from your back to your side while in a flat bed without using bedrails?: None Help needed moving from lying on your back to sitting on the side of a flat bed without using bedrails?: A Little Help needed moving to and from a bed to a chair (including a wheelchair)?: A Little Help needed standing up from a chair using your arms (e.g., wheelchair or bedside chair)?: A Little Help needed to walk in hospital room?: A Little Help needed climbing 3-5 steps with a railing? : A Little 6 Click Score: 19    End of Session Equipment Utilized During Treatment: Gait belt Activity Tolerance: Patient tolerated treatment well Patient left: in bed;with call bell/phone within reach;Other (comment)(SLP in room) Nurse Communication: Mobility status PT Visit Diagnosis: Muscle weakness (generalized) (M62.81)    Time: 2130-8657 PT Time Calculation (min) (ACUTE ONLY): 25 min   Charges:   PT Evaluation $PT Eval Low Complexity: 1 Low PT Treatments $Gait Training: 8-22 mins        Lorrin Goodell, PT  Office # [phone] Pager [phone]   Lorriane Shire 11/07/2019, 2:58 PM

## 2019-11-07 NOTE — Progress Notes (Signed)
Initial Nutrition Assessment  DOCUMENTATION CODES:   Not applicable  INTERVENTION:  -Ensure Enlive po BID, each supplement provides 350 kcal and 20 grams of protein -Magic cup TID with meals, each supplement provides 290 kcal and 9 grams of protein -MVI with minerals daily   NUTRITION DIAGNOSIS:   Inadequate oral intake related to poor appetite(adult failure to thrive) as evidenced by per patient/family report(ongoing poor po intake s/p 2015 CVA, skipping meals at facility).  GOAL:   Patient will meet greater than or equal to 90% of their needs  MONITOR:   PO intake, Labs, I & O's, Supplement acceptance, Weight trends  REASON FOR ASSESSMENT:   Consult Assessment of nutrition requirement/status, Poor PO  ASSESSMENT:  RD working remotely.  73 year old male with past medical history significant for CVA, CAD, HLD, HTN, GERD, ischemic cardiomyopathy who presented form nursing facility via EMS with complaints of lightheadedness. Patient found to be hypotensive and hypoglycemic with CBG in the 40s by EMS; improved s/p IV fluid and D50.  Patient admitted with adult failure to thrive.  Per chart review, patient reports that he did not eat lunch or dinner the day prior to presentation. Patient reports ongoing poor PO since CVA in 2015. Patient noted feeling okay this morning and tolerating po intake, currently eating 80-100% of meals.   Limited weight history for review; noted 1 lb wt loss over the past 3 weeks. Although insignificant for time frame, concerning due to advanced age and reported history of poor po. Will provide Ensure and Magic Cup to aid with calorie/protein needs. Full assessment to follow.   Medications reviewed and include: Remeron, Protonix, Colace Mg sulfate 2g/50 ml Labs: CBGS 112-168 Mg 1.5 (L) K 2.3 (L)  NUTRITION - FOCUSED PHYSICAL EXAM: Unable to complete at this time, RD working remotely.  Diet Order:   Diet Order            Diet Heart Room service  appropriate? Yes; Fluid consistency: Thin  Diet effective now              EDUCATION NEEDS:   No education needs have been identified at this time  Skin:  Skin Assessment: Reviewed RN Assessment  Last BM:  11/6; type 2  Height:   Ht Readings from Last 1 Encounters:  11/06/19 5\' 9"  (1.753 m)    Weight:   Wt Readings from Last 1 Encounters:  11/07/19 63.7 kg    Ideal Body Weight:  72.7 kg  BMI:  Body mass index is 20.74 kg/m.  Estimated Nutritional Needs:   Kcal:  1900-2100  Protein:  76-89  Fluid:  >/= 1.9 L/day   Lajuan Lines, RD, LDN Clinical Nutrition [facility] Telephone [phone] After Hours/Weekend Pager: [phone]

## 2019-11-07 NOTE — Progress Notes (Signed)
PROGRESS NOTE    Anthony ConnJerry L Coalson  WUJ:[id number] DOB: 04/17/1946 DOA: 11/06/2019 PCP: Georgann HousekeeperHusain, Karrar, MD     Brief Narrative:  Anthony Richmond is a 73 y.o. male with medical history significant of CVA; HLD; and HTN presenting with dizziness.  He reports that his BP was previously high, but lower during the last year.  He gets dizzy and feels like the "ground's just moving on me."  This morning, he started sweating and didn't feel well.  He called his son and his son called 911.  He did not eat lunch or dinner yesterday, poor PO intake since his CVA in 2015. In the ED, he was found to be hypotensive, with SBP 60 and hypoglycemia with blood sugar 40s. Sepsis work up initiated. He was also found to have AKI with Cr 1.7.    New events last 24 hours / Subjective: Feeling okay this morning.  No new complaints.  He denies any fevers or chills at home.  No cough, nausea, vomiting, diarrhea, abdominal pain or dysuria.  He was able to eat breakfast and tolerate well.  Discussed with patient on working with PT OT today  Assessment & Plan:   Principal Problem:   Failure to thrive in adult Active Problems:   Benign hypertension   HLD (hyperlipidemia)   HFrEF (heart failure with reduced ejection fraction) (HCC)   Coronary artery disease involving native coronary artery of native heart without angina pectoris   Depression   Tobacco dependence   Failure to thrive -Patient presented with dizziness, hypotension, hypothermia - initial concern for sepsis but could be related to dehydration and FTT rather than sepsis  -No apparent infectious etiology at this time. Antibiotics stopped for now.  -Blood culture negative to date -Urine cultures pending -Covid 19 negative  -PT OT dietitian consulted   AKI -Uncertain baseline creatinine -Continue IVF  -Cr 1.79 --> 1.66   Hypokalemia -Replace, trend   Hypomagnesemia -Replace, trend  Depression -Patient reports that he has been "going downhill" since  CVA 5 years ago -His son moved [place] about a year ago -The patient reports depression without active SI -Remeron dose increased   HTN -For now will decrease Coreg dose to 3.125 mg BID - likely still needs for CVD protection, but he also had hypotension on presentation  HLD -Continue Pravachol  CAD/HFrEF -Continue ASA/Plavix, coreg, statin  -No ACE due to renal dysfunction and low BPs  Tobacco dependence -Encourage cessation   DVT prophylaxis: Lovenox Code Status: Full Family Communication: No family at bedside Disposition Plan: PT OT eval pending. Aggressive potassium replacement due to severe hypokalemia. Continue IVF.    Consultants:   None  Procedures:   None   Antimicrobials:  Anti-infectives (From admission, onward)   Start     Dose/Rate Route Frequency Ordered Stop   11/07/19 1000  vancomycin (VANCOCIN) IVPB 750 mg/150 ml premix  Status:  Discontinued     750 mg 150 mL/hr over 60 Minutes Intravenous Every 24 hours 11/06/19 0558 11/06/19 1636   11/06/19 2200  ceFEPIme (MAXIPIME) 2 g in sodium chloride 0.9 % 100 mL IVPB  Status:  Discontinued     2 g 200 mL/hr over 30 Minutes Intravenous Every 12 hours 11/06/19 0558 11/06/19 1636   11/06/19 0430  ceFEPIme (MAXIPIME) 2 g in sodium chloride 0.9 % 100 mL IVPB     2 g 200 mL/hr over 30 Minutes Intravenous  Once 11/06/19 0420 11/06/19 [id number]  11/06/19 0430  metroNIDAZOLE (FLAGYL) IVPB 500 mg     500 mg 100 mL/hr over 60 Minutes Intravenous  Once 11/06/19 0420 11/06/19 0638   11/06/19 0430  vancomycin (VANCOCIN) IVPB 1000 mg/200 mL premix     1,000 mg 200 mL/hr over 60 Minutes Intravenous  Once 11/06/19 0420 11/06/19 0849        Objective: Vitals:   11/06/19 1712 11/06/19 2139 11/07/19 0407 11/07/19 0544  BP: (!) 141/66 139/81  123/62  Pulse: 84 86  78  Resp: 17 16  16   Temp: 98.2 F (36.8 C) 98.1 F (36.7 C)  97.9 F (36.6 C)  TempSrc: Oral Oral  Oral  SpO2:  100% 100%  100%  Weight:   63.7 kg   Height:        Intake/Output Summary (Last 24 hours) at 11/07/2019 0823 Last data filed at 11/07/2019 0600 Gross per 24 hour  Intake 730 ml  Output 1600 ml  Net -870 ml   Filed Weights   11/06/19 0429 11/07/19 0407  Weight: 63.5 kg 63.7 kg    Examination:  General exam: Appears calm and comfortable  Respiratory system: Clear to auscultation. Respiratory effort normal. No respiratory distress. No conversational dyspnea.  Cardiovascular system: S1 & S2 heard, RRR. No murmurs. No pedal edema. Gastrointestinal system: Abdomen is nondistended, soft and nontender. Normal bowel sounds heard. Central nervous system: Alert and oriented. No focal neurological deficits. Speech clear.  Extremities: Symmetric in appearance  Skin: No rashes, lesions or ulcers on exposed skin  Psychiatry: Judgement and insight appear normal. Mood & affect appropriate.   Data Reviewed: I have personally reviewed following labs and imaging studies  CBC: Recent Labs  Lab 11/06/19 0420 11/07/19 0213  WBC 6.6 6.7  NEUTROABS 5.5  --   HGB 14.8 13.5  HCT 43.2 38.7*  MCV 100.0 97.7  PLT 135* 161*   Basic Metabolic Panel: Recent Labs  Lab 11/06/19 0420 11/07/19 0213  NA 132* 136  K 3.2* 2.3*  CL 97* 105  CO2 19* 21*  GLUCOSE 182* 84  BUN 13 14  CREATININE 1.79* 1.66*  CALCIUM 8.9 8.7*   GFR: Estimated Creatinine Clearance: 36.2 mL/min (A) (by C-G formula based on SCr of 1.66 mg/dL (H)). Liver Function Tests: Recent Labs  Lab 11/06/19 0420  AST 26  ALT 19  ALKPHOS 111  BILITOT 1.1  PROT 7.1  ALBUMIN 3.7   No results for input(s): LIPASE, AMYLASE in the last 168 hours. No results for input(s): AMMONIA in the last 168 hours. Coagulation Profile: Recent Labs  Lab 11/06/19 0420  INR 1.0   Cardiac Enzymes: No results for input(s): CKTOTAL, CKMB, CKMBINDEX, TROPONINI in the last 168 hours. BNP (last 3 results) No results for input(s): PROBNP in the  last 8760 hours. HbA1C: No results for input(s): HGBA1C in the last 72 hours. CBG: Recent Labs  Lab 11/06/19 0418 11/06/19 0511 11/06/19 0602 11/06/19 0703  GLUCAP 168* 149* 118* 112*   Lipid Profile: No results for input(s): CHOL, HDL, LDLCALC, TRIG, CHOLHDL, LDLDIRECT in the last 72 hours. Thyroid Function Tests: No results for input(s): TSH, T4TOTAL, FREET4, T3FREE, THYROIDAB in the last 72 hours. Anemia Panel: No results for input(s): VITAMINB12, FOLATE, FERRITIN, TIBC, IRON, RETICCTPCT in the last 72 hours. Sepsis Labs: Recent Labs  Lab 11/06/19 0420 11/06/19 0505 11/06/19 0730 11/06/19 1814 11/07/19 0213  PROCALCITON <0.10  --   --   --  0.21  LATICACIDVEN  --  3.3* 2.5* 1.7  --  Recent Results (from the past 240 hour(s))  Blood Culture (routine x 2)     Status: None (Preliminary result)   Collection Time: 11/06/19  5:00 AM   Specimen: BLOOD RIGHT FOREARM  Result Value Ref Range Status   Specimen Description BLOOD RIGHT FOREARM  Final   Special Requests   Final    BOTTLES DRAWN AEROBIC AND ANAEROBIC Blood Culture results may not be optimal due to an inadequate volume of blood received in culture bottles   Culture   Final    NO GROWTH 1 DAY Performed at [facility], [address] [place], [place] [address]    Report Status PENDING  Incomplete  Blood Culture (routine x 2)     Status: None (Preliminary result)   Collection Time: 11/06/19  5:05 AM   Specimen: BLOOD RIGHT HAND  Result Value Ref Range Status   Specimen Description BLOOD RIGHT HAND  Final   Special Requests   Final    BOTTLES DRAWN AEROBIC AND ANAEROBIC Blood Culture results may not be optimal due to an inadequate volume of blood received in culture bottles   Culture   Final    NO GROWTH 1 DAY Performed at [facility], [address] [place], [place] [address]    Report Status PENDING  Incomplete  SARS CORONAVIRUS 2 (TAT 6-24 HRS) Nasopharyngeal Nasopharyngeal Swab     Status:  None   Collection Time: 11/06/19  5:58 AM   Specimen: Nasopharyngeal Swab  Result Value Ref Range Status   SARS Coronavirus 2 NEGATIVE NEGATIVE Final    Comment: (NOTE) SARS-CoV-2 target nucleic acids are NOT DETECTED. The SARS-CoV-2 RNA is generally detectable in upper and lower respiratory specimens during the acute phase of infection. Negative results do not preclude SARS-CoV-2 infection, do not rule out co-infections with other pathogens, and should not be used as the sole basis for treatment or other patient management decisions. Negative results must be combined with clinical observations, patient history, and epidemiological information. The expected result is Negative. Fact Sheet for Patients: HairSlick.no Fact Sheet for Healthcare Providers: quierodirigir.com This test is not yet approved or cleared by the [place] FDA and  has been authorized for detection and/or diagnosis of SARS-CoV-2 by FDA under an Emergency Use Authorization (EUA). This EUA will remain  in effect (meaning this test can be used) for the duration of the COVID-19 declaration under Section 56 4(b)(1) of the Act, 21 U.S.C. section 360bbb-3(b)(1), unless the authorization is terminated or revoked sooner. Performed at [facility], [address] [place], [place] [address]       Radiology Studies: Dg Chest Port 1 View  Result Date: 11/06/2019 CLINICAL DATA:  Hypotension. EXAM: PORTABLE CHEST 1 VIEW COMPARISON:  None. FINDINGS: There is no large pleural effusion. The heart size is normal. The patient is status post prior median sternotomy. A loop recorder is noted. There is no pneumothorax. IMPRESSION: No active disease. Electronically Signed   By: Katherine Mantle M.D.   On: 11/06/2019 04:50      Scheduled Meds: . aspirin EC  325 mg Oral Daily  . carvedilol  3.125 mg Oral BID WC  . clopidogrel  75 mg Oral Daily  . docusate sodium   100 mg Oral BID  . enoxaparin (LOVENOX) injection  40 mg Subcutaneous Q24H  . mirtazapine  15 mg Oral QHS  . pantoprazole  40 mg Oral Daily  . pravastatin  40 mg Oral Daily  . sodium  chloride flush  3 mL Intravenous Q12H  . tamsulosin  0.4 mg Oral Daily   Continuous Infusions: . sodium chloride Stopped (11/06/19 1700)  . potassium chloride 10 mEq (11/07/19 0818)     LOS: 1 day      Time spent: 35 minutes   Noralee Stain, DO [facility] 11/07/2019, 8:23 AM   Available via Epic secure chat 7am-7pm After these hours, please refer to coverage provider listed on [email or web address]

## 2019-11-07 NOTE — Progress Notes (Signed)
Occupational Therapy Evaluation Patient Details Name: Anthony Richmond MRN: [id number] DOB: May 22, 1946 Today's Date: 11/07/2019    History of Present Illness 73 y.o. male with medical history significant of CVA; HLD; and HTN presenting with dizziness, hypotension, and hypothermia. Admitted for failure to thrive and dehydration.   Clinical Impression   PTA, pt was living at ILF at [facility], pt reports he was independent with ADL and had his meals provided by the facility and reports using a rollator and spc during functional mobiltiy. Pt currently requires minguard for LB ADL, toilet transfer and grooming at sink level. Pt requires minguard for functional mobility at RW level. Due to decline in current level of function, pt would benefit from acute OT to address established goals to facilitate safe D/C to venue listed below. At this time, recommend HHOT follow-up. Will continue to follow acutely.     Follow Up Recommendations  Home health OT;Supervision - Intermittent    Equipment Recommendations  None recommended by OT    Recommendations for Other Services       Precautions / Restrictions Precautions Precautions: Fall Restrictions Weight Bearing Restrictions: No      Mobility Bed Mobility Overal bed mobility: Modified Independent             General bed mobility comments: HOB elevated. +rail. Increased time.  Transfers Overall transfer level: Needs assistance Equipment used: Rolling walker (2 wheeled) Transfers: Sit to/from Stand Sit to Stand: Min guard         General transfer comment: min guard for safety. No physical assist.    Balance Overall balance assessment: Needs assistance Sitting-balance support: No upper extremity supported;Feet supported Sitting balance-Leahy Scale: Good     Standing balance support: No upper extremity supported;During functional activity Standing balance-Leahy Scale: Fair Standing balance comment: static stand without AD  support. RW for ambulation                           ADL either performed or assessed with clinical judgement   ADL Overall ADL's : Needs assistance/impaired Eating/Feeding: Set up;Sitting   Grooming: Min guard;Standing Grooming Details (indicate cue type and reason): completed ADL at sink level Upper Body Bathing: Min guard;Sitting   Lower Body Bathing: Min guard;Sit to/from stand   Upper Body Dressing : Min guard;Sitting   Lower Body Dressing: Min guard;Sit to/from stand Lower Body Dressing Details (indicate cue type and reason): able to bend over to don/doff socks and don briefs Toilet Transfer: Min guard;Ambulation Toilet Transfer Details (indicate cue type and reason): ambulated to commode  Toileting- Clothing Manipulation and Hygiene: Min guard       Functional mobility during ADLs: Min guard;Rolling walker General ADL Comments: minguard for safety and stability     Vision         Perception     Praxis      Pertinent Vitals/Pain Pain Assessment: No/denies pain     Hand Dominance     Extremity/Trunk Assessment Upper Extremity Assessment Upper Extremity Assessment: Generalized weakness   Lower Extremity Assessment Lower Extremity Assessment: Generalized weakness   Cervical / Trunk Assessment Cervical / Trunk Assessment: Kyphotic   Communication Communication Communication: No difficulties   Cognition Arousal/Alertness: Awake/alert Behavior During Therapy: WFL for tasks assessed/performed Overall Cognitive Status: Within Functional Limits for tasks assessed  General Comments  pt's grandson present during session;pt reports his daughter-in-law is an OT    Exercises     Shoulder [place] expects to be discharged to:: Private residence Living Arrangements: Alone Available Help at Discharge: Family;Available PRN/intermittently Type of Home:  Independent living facility Home Access: Elevator     Home Layout: One level     Bathroom Shower/Tub: Occupational psychologist: Handicapped height Bathroom Accessibility: Yes   Home Equipment: [place] - single point;Shower seat;Walker - 4 wheels;Grab bars - toilet;Grab bars - tub/shower          Prior Functioning/Environment Level of Independence: Independent with assistive device(s)        Comments: amb with rollator vs cane        OT Problem List: Impaired balance (sitting and/or standing);Decreased activity tolerance;Decreased safety awareness;Decreased knowledge of use of DME or AE      OT Treatment/Interventions: Self-care/ADL training;Therapeutic exercise;Energy conservation;DME and/or AE instruction;Therapeutic activities;Patient/family education;Balance training    OT Goals(Current goals can be found in the care plan section) Acute Rehab OT Goals Patient Stated Goal: move back to [place] OT Goal Formulation: With patient Time For Goal Achievement: 11/21/19 Potential to Achieve Goals: Good ADL Goals Pt Will Perform Grooming: with modified independence;standing Pt Will Perform Upper Body Dressing: with modified independence;standing Pt Will Perform Lower Body Dressing: with modified independence;sit to/from stand Pt Will Transfer to Toilet: with modified independence;ambulating Pt Will Perform Toileting - Clothing Manipulation and hygiene: with modified independence;sit to/from stand  OT Frequency: Min 2X/week   Barriers to D/C:            Co-evaluation              AM-PAC OT "6 Clicks" Daily Activity     Outcome Measure Help from another person eating meals?: A Little Help from another person taking care of personal grooming?: A Little Help from another person toileting, which includes using toliet, bedpan, or urinal?: A Little Help from another person bathing (including washing, rinsing, drying)?: A Little Help from another person to put on and  taking off regular upper body clothing?: A Little Help from another person to put on and taking off regular lower body clothing?: A Little 6 Click Score: 18   End of Session Equipment Utilized During Treatment: Gait belt;Rolling walker Nurse Communication: Mobility status  Activity Tolerance: Patient tolerated treatment well Patient left: in bed;with call bell/phone within reach;with family/visitor present;with bed alarm set  OT Visit Diagnosis: Unsteadiness on feet (R26.81);Other abnormalities of gait and mobility (R26.89);History of falling (Z91.81)                Time: 8676-7209 OT Time Calculation (min): 16 min Charges:  OT General Charges $OT Visit: 1 Visit OT Evaluation $OT Eval Moderate Complexity: [place] OTR/L Acute Rehabilitation Services Office: [place] 11/07/2019, 3:56 PM

## 2019-11-07 NOTE — Evaluation (Signed)
Clinical/Bedside Swallow Evaluation Patient Details  Name: NICKOLAOS BRALLIER MRN: [id number] Date of Birth: Jun 30, 1946  Today's Date: 11/07/2019 Time: SLP Start Time (ACUTE ONLY): 45 SLP Stop Time (ACUTE ONLY): 1428 SLP Time Calculation (min) (ACUTE ONLY): 18 min  Past Medical History:  Past Medical History:  Diagnosis Date  . Benign hypertension 07/15/2019  . Coronary artery disease without angina pectoris 07/15/2019  . CVA (cerebral vascular accident) ([place]) 2015  . Dehydration 11/06/2019  . GERD (gastroesophageal reflux disease)   . HLD (hyperlipidemia)   . Ischemic cardiomyopathy 07/15/2019  . NSVT (nonsustained ventricular tachycardia) ([place]) 07/15/2019  . Personal history of transient ischemic attack    Past Surgical History:  Past Surgical History:  Procedure Laterality Date  . CORONARY ARTERY BYPASS GRAFT  1995   [place] [facility]  . HIP SURGERY     HPI:  RALIEGH SCOBIE is a 73 y.o. male with medical history significant of CVA; HLD; and HTN presenting with dizziness.  Poor PO intake since his CVA in 2015.  In the ED, he was found to be hypotensive, with SBP 60 and hypoglycemia with blood sugar 40s. Sepsis work up initiated.   Assessment / Plan / Recommendation Clinical Impression   Pt presents with no overt s/s of aspiration at bedside with solid or liquid consistencies.  Pt does endorse a prolonged history of complaints which appear consistent with some extent of esophageal dysfunction or even simply presbyesophagus.  He has gradually stopped eating most meats because he feels they get "hung" and he has to spit them out.  Pt also endorses poor appetite since his CVA 5+ years ago with concomitant weight loss.  SLP provided skilled education regarding avoiding certain meats that "get stuck" going down and replacing them with moist, soft, or ground meats to maximize comfort with PO intake.  SLP also informed pt that he could seek follow up for an assessment of esophageal function if he or  his primary physician wishes as this could provide further insight into the nature of his symptoms.  No further acute ST needs identified at this time and I would recommend that pt remain on his current diet of regular textures and thin liquids.    SLP Visit Diagnosis: Dysphagia, pharyngoesophageal phase (R13.14)    Aspiration Risk  Mild aspiration risk    Diet Recommendation Regular;Thin liquid   Liquid Administration via: Cup;Straw Medication Administration: Whole meds with liquid Supervision: Patient able to self feed Compensations: Minimize environmental distractions;Slow rate;Small sips/bites;Follow solids with liquid Postural Changes: Seated upright at 90 degrees;Remain upright for at least 30 minutes after po intake    Other  Recommendations Recommended Consults: Consider esophageal assessment Oral Care Recommendations: Oral care BID   Follow up Recommendations None        Swallow Study   General HPI: BARRIE WALE is a 73 y.o. male with medical history significant of CVA; HLD; and HTN presenting with dizziness.  Poor PO intake since his CVA in 2015.  In the ED, he was found to be hypotensive, with SBP 60 and hypoglycemia with blood sugar 40s. Sepsis work up initiated. Type of Study: Bedside Swallow Evaluation Previous Swallow Assessment: none on record Diet Prior to this Study: Regular;Thin liquids Temperature Spikes Noted: No Respiratory Status: Room air History of Recent Intubation: No Behavior/Cognition: Alert;Cooperative;Pleasant mood Oral Care Completed by SLP: No Oral Cavity - Dentition: Adequate natural dentition Vision: Functional for self-feeding Self-Feeding Abilities: Able to feed self Patient Positioning: Upright in bed Baseline Vocal Quality:  Normal    Oral/Motor/Sensory Function Overall Oral Motor/Sensory Function: Within functional limits   Ice Chips     Thin Liquid Thin Liquid: Within functional limits    Nectar Thick     Honey Thick     Puree      Solid     Solid: Within functional limits      Dunia Pringle, Joni Reining L 11/07/2019,2:35 PM

## 2019-11-08 LAB — BASIC METABOLIC PANEL
Anion gap: 9 (ref 5–15)
BUN: 11 mg/dL (ref 8–23)
CO2: 21 mmol/L — ABNORMAL LOW (ref 22–32)
Calcium: 8.5 mg/dL — ABNORMAL LOW (ref 8.9–10.3)
Chloride: 108 mmol/L (ref 98–111)
Creatinine, Ser: 1.54 mg/dL — ABNORMAL HIGH (ref 0.61–1.24)
GFR calc Af Amer: 51 mL/min — ABNORMAL LOW (ref >60)
GFR calc non Af Amer: 44 mL/min — ABNORMAL LOW (ref >60)
Glucose, Bld: 91 mg/dL (ref 70–99)
Potassium: 2.5 mmol/L — CL (ref 3.5–5.1)
Sodium: 138 mmol/L (ref 135–145)

## 2019-11-08 LAB — CBC
HCT: 38.8 % — ABNORMAL LOW (ref 39.0–52.0)
Hemoglobin: 13.5 g/dL (ref 13.0–17.0)
MCH: 34.4 pg — ABNORMAL HIGH (ref 26.0–34.0)
MCHC: 34.8 g/dL (ref 30.0–36.0)
MCV: 98.7 fL (ref 80.0–100.0)
Platelets: 131 10*3/uL — ABNORMAL LOW (ref 150–400)
RBC: 3.93 MIL/uL — ABNORMAL LOW (ref 4.22–5.81)
RDW: 12 % (ref 11.5–15.5)
WBC: 7 10*3/uL (ref 4.0–10.5)
nRBC: 0 % (ref 0.0–0.2)

## 2019-11-08 LAB — MAGNESIUM: Magnesium: 1.6 mg/dL — ABNORMAL LOW (ref 1.7–2.4)

## 2019-11-08 LAB — PROCALCITONIN: Procalcitonin: 0.19 ng/mL

## 2019-11-08 MED ORDER — POTASSIUM CHLORIDE 20 MEQ PO PACK
40.0000 meq | PACK | Freq: Once | ORAL | Status: AC
  Administered 2019-11-08: 40 meq via ORAL
  Filled 2019-11-08: qty 2

## 2019-11-08 MED ORDER — POTASSIUM CHLORIDE 10 MEQ/100ML IV SOLN
10.0000 meq | INTRAVENOUS | Status: AC
  Administered 2019-11-08 (×6): 10 meq via INTRAVENOUS
  Filled 2019-11-08 (×6): qty 100

## 2019-11-08 MED ORDER — MAGNESIUM SULFATE 2 GM/50ML IV SOLN
2.0000 g | Freq: Once | INTRAVENOUS | Status: AC
  Administered 2019-11-08: 10:00:00 2 g via INTRAVENOUS
  Filled 2019-11-08: qty 50

## 2019-11-08 MED ORDER — MAGNESIUM SULFATE 2 GM/50ML IV SOLN
2.0000 g | Freq: Once | INTRAVENOUS | Status: AC
  Administered 2019-11-08: 05:00:00 2 g via INTRAVENOUS
  Filled 2019-11-08: qty 50

## 2019-11-08 MED ORDER — POTASSIUM CHLORIDE CRYS ER 20 MEQ PO TBCR: 40.0000 meq | EXTENDED_RELEASE_TABLET | Freq: Once | ORAL | Status: DC

## 2019-11-08 NOTE — Plan of Care (Signed)
  Problem: Coping: Goal: Ability to identify and develop effective coping behavior will improve Outcome: Progressing   Problem: Health Behavior/Discharge Planning: Goal: Identification of resources available to assist in meeting health care needs will improve Outcome: Progressing   Problem: Self-Concept: Goal: Will verbalize positive feelings about self Outcome: Progressing   Problem: Coping: Goal: Ability to identify and develop effective coping behavior will improve Outcome: Progressing

## 2019-11-08 NOTE — Progress Notes (Signed)
Son of pt brought in copies of pt's [place] as well as general Power of [employer]. Hard copies of both were added to pt's chart.

## 2019-11-08 NOTE — Plan of Care (Signed)
  Problem: Activity: Goal: Will identify at least one activity in which they can participate Outcome: Progressing   Problem: Coping: Goal: Ability to identify and develop effective coping behavior will improve Outcome: Progressing Goal: Ability to interact with others will improve Outcome: Progressing Goal: Demonstration of participation in decision-making regarding own care will improve Outcome: Progressing Goal: Ability to use eye contact when communicating with others will improve Outcome: Progressing   Problem: Health Behavior/Discharge Planning: Goal: Identification of resources available to assist in meeting health care needs will improve Outcome: Progressing   Problem: Self-Concept: Goal: Will verbalize positive feelings about self Outcome: Progressing   Problem: Activity: Goal: Will identify at least one activity in which they can participate Outcome: Progressing   Problem: Coping: Goal: Ability to identify and develop effective coping behavior will improve Outcome: Progressing Goal: Ability to interact with others will improve Outcome: Progressing Goal: Demonstration of participation in decision-making regarding own care will improve Outcome: Progressing Goal: Ability to use eye contact when communicating with others will improve Outcome: Progressing   Problem: Health Behavior/Discharge Planning: Goal: Identification of resources available to assist in meeting health care needs will improve Outcome: Progressing   Problem: Self-Concept: Goal: Will verbalize positive feelings about self Outcome: Progressing   Problem: Education: Goal: Knowledge of General Education information will improve Description: Including pain rating scale, medication(s)/side effects and non-pharmacologic comfort measures Outcome: Progressing   Problem: Health Behavior/Discharge Planning: Goal: Ability to manage health-related needs will improve Outcome: Progressing   Problem:  Clinical Measurements: Goal: Ability to maintain clinical measurements within normal limits will improve Outcome: Progressing Goal: Will remain free from infection Outcome: Progressing Goal: Diagnostic test results will improve Outcome: Progressing Goal: Respiratory complications will improve Outcome: Progressing Goal: Cardiovascular complication will be avoided Outcome: Progressing   Problem: Activity: Goal: Risk for activity intolerance will decrease Outcome: Progressing   Problem: Nutrition: Goal: Adequate nutrition will be maintained Outcome: Progressing   Problem: Coping: Goal: Level of anxiety will decrease Outcome: Progressing   Problem: Elimination: Goal: Will not experience complications related to bowel motility Outcome: Progressing Goal: Will not experience complications related to urinary retention Outcome: Progressing   Problem: Pain Managment: Goal: General experience of comfort will improve Outcome: Progressing   Problem: Safety: Goal: Ability to remain free from injury will improve Outcome: Progressing   Problem: Skin Integrity: Goal: Risk for impaired skin integrity will decrease Outcome: Progressing

## 2019-11-08 NOTE — Progress Notes (Signed)
   11/08/19 2127  MEWS Score  Resp 19  Pulse Rate 77  BP 117/63  Temp 98.4 F (36.9 C)  SpO2 98 %  O2 Device Room Air  MEWS Score  MEWS RR 0  MEWS Pulse 0  MEWS Systolic 0  MEWS LOC 0  MEWS Temp 0  MEWS Score 0  MEWS Score Color Green  MEWS Assessment  Is this an acute change? No  MEWS Guidelines - (patients age 72 and over)  Red - At High Risk for Deterioration Yellow - At risk for Deterioration  1. Go to room and assess patient 2. Validate data. Is this patient's baseline? If data confirmed: 3. Is this an acute change? 4. Administer prn meds/treatments as ordered. 5. Note Sepsis score 6. Review goals of care 7. Sports coach, RRT nurse and Provider. 8. Ask Provider to come to bedside.  9. Document patient condition/interventions/response. 10. Increase frequency of vital signs and focused assessments to at least q15 minutes x 4, then q30 minutes x2. - If stable, then q1h x3, then q4h x3 and then q8h or dept. routine. - If unstable, contact Provider & RRT nurse. Prepare for possible transfer. 11. Add entry in progress notes using the smart phrase ".MEWS". 1. Go to room and assess patient 2. Validate data. Is this patient's baseline? If data confirmed: 3. Is this an acute change? 4. Administer prn meds/treatments as ordered? 5. Note Sepsis score 6. Review goals of care 7. Sports coach and Provider 8. Call RRT nurse as needed. 9. Document patient condition/interventions/response. 10. Increase frequency of vital signs and focused assessments to at least q2h x2. - If stable, then q4h x2 and then q8h or dept. routine. - If unstable, contact Provider & RRT nurse. Prepare for possible transfer. 11. Add entry in progress notes using the smart phrase ".MEWS".  Green - Likely stable [place] - Comfort Care Only  1. Continue routine/ordered monitoring.  2. Review goals of care. 1. Continue routine/ordered monitoring. 2. Review goals of care.

## 2019-11-08 NOTE — Progress Notes (Signed)
PROGRESS NOTE    Anthony Richmond  OIZ:[id number] DOB: 25-Apr-1946 DOA: 11/06/2019 PCP: [name], MD     Brief Narrative:  Anthony Richmond is a 73 y.o. male with medical history significant of CVA; HLD; and HTN presenting with dizziness.  He reports that his BP was previously high, but lower during the last year.  He gets dizzy and feels like the "ground's just moving on me."  This morning, he started sweating and didn't feel well.  He called his son and his son called 911.  He did not eat lunch or dinner yesterday, poor PO intake since his CVA in 2015. In the ED, he was found to be hypotensive, with SBP 60 and hypoglycemia with blood sugar 40s. Sepsis work up initiated. He was also found to have AKI with Cr 1.7.    New events last 24 hours / Subjective: No complaints this morning denies any vomiting or diarrhea.  Has been tolerating meals without any issue.  Assessment & Plan:   Principal Problem:   Failure to thrive in adult Active Problems:   Benign hypertension   HLD (hyperlipidemia)   HFrEF (heart failure with reduced ejection fraction) (HCC)   Coronary artery disease involving native coronary artery of native heart without angina pectoris   Depression   Tobacco dependence    Failure to thrive -Patient presented with dizziness, hypotension, hypothermia - initial concern for sepsis but could be related to dehydration and FTT rather than sepsis  -No apparent infectious etiology at this time. Antibiotics stopped for now.  -Blood culture negative to date -Urine cultures negative  -Covid 19 negative  -PT OT dietitian consulted --> home health HT OT rec   AKI -Uncertain baseline creatinine -Cr 1.79 --> 1.66 --> 1.54   Hypokalemia -Replace, trend   Hypomagnesemia -Replace, trend  Depression -Patient reports that he has been "going downhill" since CVA 5 years ago -His son moved him from his home in [place] to [place] about a year ago -The patient reports depression  without active SI -Remeron dose increased   HTN -For now will decrease Coreg dose to 3.125 mg BID - likely still needs for CVD protection, but he also had hypotension on presentation  HLD -Continue Pravachol  CAD/HFrEF -Continue ASA/Plavix, coreg, statin  -No ACE due to renal dysfunction and low BPs  Tobacco dependence -Encourage cessation   DVT prophylaxis: Lovenox Code Status: Full Family Communication: Discussed with son over the phone  Disposition Plan:  Aggressive potassium replacement due to severe hypokalemia.    Consultants:   None  Procedures:   None   Antimicrobials:  Anti-infectives (From admission, onward)   Start     Dose/Rate Route Frequency Ordered Stop   11/07/19 1000  vancomycin (VANCOCIN) IVPB 750 mg/150 ml premix  Status:  Discontinued     750 mg 150 mL/hr over 60 Minutes Intravenous Every 24 hours 11/06/19 0558 11/06/19 1636   11/06/19 2200  ceFEPIme (MAXIPIME) 2 g in sodium chloride 0.9 % 100 mL IVPB  Status:  Discontinued     2 g 200 mL/hr over 30 Minutes Intravenous Every 12 hours 11/06/19 0558 11/06/19 1636   11/06/19 0430  ceFEPIme (MAXIPIME) 2 g in sodium chloride 0.9 % 100 mL IVPB     2 g 200 mL/hr over 30 Minutes Intravenous  Once 11/06/19 0420 11/06/19 0634   11/06/19 0430  metroNIDAZOLE (FLAGYL) IVPB 500 mg     500 mg 100 mL/hr over 60 Minutes Intravenous  Once 11/06/19  5427 11/06/19 0638   11/06/19 0430  vancomycin (VANCOCIN) IVPB 1000 mg/200 mL premix     1,000 mg 200 mL/hr over 60 Minutes Intravenous  Once 11/06/19 0420 11/06/19 0849       Objective: Vitals:   11/07/19 0407 11/07/19 0544 11/07/19 2034 11/08/19 0609  BP:  123/62 134/74 132/62  Pulse:  78 79 77  Resp:  16 20 20   Temp:  97.9 F (36.6 C) 98.5 F (36.9 C) 97.8 F (36.6 C)  TempSrc:  Oral Oral Oral  SpO2:  100% 100% 99%  Weight: 63.7 kg     Height:        Intake/Output Summary (Last 24 hours) at 11/08/2019 1038 Last data filed at 11/08/2019 0830 Gross  per 24 hour  Intake 1935.23 ml  Output 1550 ml  Net 385.23 ml   Filed Weights   11/06/19 0429 11/07/19 0407  Weight: 63.5 kg 63.7 kg    Examination: General exam: Appears calm and comfortable  Respiratory system: Clear to auscultation. Respiratory effort normal. Cardiovascular system: S1 & S2 heard, RRR. No pedal edema. Gastrointestinal system: Abdomen is nondistended, soft and nontender. Normal bowel sounds heard. Central nervous system: Alert and oriented. Non focal exam. Speech clear  Extremities: Symmetric in appearance bilaterally  Skin: No rashes, lesions or ulcers on exposed skin  Psychiatry: Judgement and insight appear stable. Mood & affect appropriate.    Data Reviewed: I have personally reviewed following labs and imaging studies  CBC: Recent Labs  Lab 11/06/19 0420 11/07/19 0213 11/08/19 0319  WBC 6.6 6.7 7.0  NEUTROABS 5.5  --   --   HGB 14.8 13.5 13.5  HCT 43.2 38.7* 38.8*  MCV 100.0 97.7 98.7  PLT 135* 137* 062*   Basic Metabolic Panel: Recent Labs  Lab 11/06/19 0420 11/07/19 0213 11/07/19 0952 11/08/19 0319  NA 132* 136  --  138  K 3.2* 2.3*  --  2.5*  CL 97* 105  --  108  CO2 19* 21*  --  21*  GLUCOSE 182* 84  --  91  BUN 13 14  --  11  CREATININE 1.79* 1.66*  --  1.54*  CALCIUM 8.9 8.7*  --  8.5*  MG  --   --  1.5* 1.6*   GFR: Estimated Creatinine Clearance: 39.1 mL/min (A) (by C-G formula based on SCr of 1.54 mg/dL (H)). Liver Function Tests: Recent Labs  Lab 11/06/19 0420  AST 26  ALT 19  ALKPHOS 111  BILITOT 1.1  PROT 7.1  ALBUMIN 3.7   No results for input(s): LIPASE, AMYLASE in the last 168 hours. No results for input(s): AMMONIA in the last 168 hours. Coagulation Profile: Recent Labs  Lab 11/06/19 0420  INR 1.0   Cardiac Enzymes: No results for input(s): CKTOTAL, CKMB, CKMBINDEX, TROPONINI in the last 168 hours. BNP (last 3 results) No results for input(s): PROBNP in the last 8760 hours. HbA1C: No results for  input(s): HGBA1C in the last 72 hours. CBG: Recent Labs  Lab 11/06/19 0418 11/06/19 0511 11/06/19 0602 11/06/19 0703  GLUCAP 168* 149* 118* 112*   Lipid Profile: No results for input(s): CHOL, HDL, LDLCALC, TRIG, CHOLHDL, LDLDIRECT in the last 72 hours. Thyroid Function Tests: No results for input(s): TSH, T4TOTAL, FREET4, T3FREE, THYROIDAB in the last 72 hours. Anemia Panel: No results for input(s): VITAMINB12, FOLATE, FERRITIN, TIBC, IRON, RETICCTPCT in the last 72 hours. Sepsis Labs: Recent Labs  Lab 11/06/19 0420 11/06/19 0505 11/06/19 0730 11/06/19 1814 11/07/19 3762  11/08/19 0319  PROCALCITON <0.10  --   --   --  0.21 0.19  LATICACIDVEN  --  3.3* 2.5* 1.7  --   --     Recent Results (from the past 240 hour(s))  Blood Culture (routine x 2)     Status: None (Preliminary result)   Collection Time: 11/06/19  5:00 AM   Specimen: BLOOD RIGHT FOREARM  Result Value Ref Range Status   Specimen Description BLOOD RIGHT FOREARM  Final   Special Requests   Final    BOTTLES DRAWN AEROBIC AND ANAEROBIC Blood Culture results may not be optimal due to an inadequate volume of blood received in culture bottles   Culture   Final    NO GROWTH 2 DAYS Performed at [facility], [address], [place], [place] [address]    Report Status PENDING  Incomplete  Blood Culture (routine x 2)     Status: None (Preliminary result)   Collection Time: 11/06/19  5:05 AM   Specimen: BLOOD RIGHT HAND  Result Value Ref Range Status   Specimen Description BLOOD RIGHT HAND  Final   Special Requests   Final    BOTTLES DRAWN AEROBIC AND ANAEROBIC Blood Culture results may not be optimal due to an inadequate volume of blood received in culture bottles   Culture   Final    NO GROWTH 2 DAYS Performed at [facility], [address] [place], [place] [address]    Report Status PENDING  Incomplete  Urine culture     Status: None   Collection Time: 11/06/19  5:52 AM   Specimen: Urine,  Random  Result Value Ref Range Status   Specimen Description URINE, RANDOM  Final   Special Requests NONE  Final   Culture   Final    NO GROWTH Performed at [facility], [address], [place], [place] [address]    Report Status 11/07/2019 FINAL  Final  SARS CORONAVIRUS 2 (TAT 6-24 HRS) Nasopharyngeal Nasopharyngeal Swab     Status: None   Collection Time: 11/06/19  5:58 AM   Specimen: Nasopharyngeal Swab  Result Value Ref Range Status   SARS Coronavirus 2 NEGATIVE NEGATIVE Final    Comment: (NOTE) SARS-CoV-2 target nucleic acids are NOT DETECTED. The SARS-CoV-2 RNA is generally detectable in upper and lower respiratory specimens during the acute phase of infection. Negative results do not preclude SARS-CoV-2 infection, do not rule out co-infections with other pathogens, and should not be used as the sole basis for treatment or other patient management decisions. Negative results must be combined with clinical observations, patient history, and epidemiological information. The expected result is Negative. Fact Sheet for Patients: HairSlick.no Fact Sheet for Healthcare Providers: quierodirigir.com This test is not yet approved or cleared by the [place] FDA and  has been authorized for detection and/or diagnosis of SARS-CoV-2 by FDA under an Emergency Use Authorization (EUA). This EUA will remain  in effect (meaning this test can be used) for the duration of the COVID-19 declaration under Section 56 4(b)(1) of the Act, 21 U.S.C. section 360bbb-3(b)(1), unless the authorization is terminated or revoked sooner. Performed at [facility], [address] [place], [place] [address]       Radiology Studies: No results found.    Scheduled Meds: . aspirin EC  325 mg Oral Daily  . carvedilol  3.125 mg Oral BID WC  . clopidogrel  75 mg Oral Daily  . docusate sodium  100 mg Oral BID  . enoxaparin  (LOVENOX) injection  40 mg Subcutaneous Q24H  . feeding supplement (ENSURE ENLIVE)  237 mL Oral BID BM  . mirtazapine  15 mg Oral QHS  . multivitamin with minerals  1 tablet Oral Daily  . pantoprazole  40 mg Oral Daily  . pravastatin  40 mg Oral Daily  . sodium chloride flush  3 mL Intravenous Q12H  . tamsulosin  0.4 mg Oral Daily   Continuous Infusions: . sodium chloride 100 mL/hr at 11/07/19 2139  . potassium chloride 10 mEq (11/08/19 0934)     LOS: 2 days      Time spent: 25 minutes   Noralee StainJennifer Leelah Hanna, DO [facility] 11/08/2019, 10:38 AM   Available via Epic secure chat 7am-7pm After these hours, please refer to coverage provider listed on [email or web address]

## 2019-11-09 LAB — BASIC METABOLIC PANEL
Anion gap: 8 (ref 5–15)
BUN: 9 mg/dL (ref 8–23)
CO2: 22 mmol/L (ref 22–32)
Calcium: 8.6 mg/dL — ABNORMAL LOW (ref 8.9–10.3)
Chloride: 105 mmol/L (ref 98–111)
Creatinine, Ser: 1.51 mg/dL — ABNORMAL HIGH (ref 0.61–1.24)
GFR calc Af Amer: 53 mL/min — ABNORMAL LOW (ref >60)
GFR calc non Af Amer: 45 mL/min — ABNORMAL LOW (ref >60)
Glucose, Bld: 91 mg/dL (ref 70–99)
Potassium: 2.7 mmol/L — CL (ref 3.5–5.1)
Sodium: 135 mmol/L (ref 135–145)

## 2019-11-09 LAB — POTASSIUM: Potassium: 3.8 mmol/L (ref 3.5–5.1)

## 2019-11-09 LAB — MAGNESIUM: Magnesium: 1.9 mg/dL (ref 1.7–2.4)

## 2019-11-09 MED ORDER — POTASSIUM CHLORIDE 20 MEQ/15ML (10%) PO SOLN
40.0000 meq | Freq: Once | ORAL | Status: AC
  Administered 2019-11-09: 09:00:00 40 meq via ORAL
  Filled 2019-11-09: qty 30

## 2019-11-09 MED ORDER — POTASSIUM CHLORIDE CRYS ER 20 MEQ PO TBCR
40.0000 meq | EXTENDED_RELEASE_TABLET | Freq: Once | ORAL | Status: DC
  Filled 2019-11-09: qty 2

## 2019-11-09 MED ORDER — POTASSIUM CHLORIDE 10 MEQ/100ML IV SOLN
10.0000 meq | INTRAVENOUS | Status: AC
  Administered 2019-11-09 (×2): 10 meq via INTRAVENOUS
  Filled 2019-11-09 (×2): qty 100

## 2019-11-09 MED ORDER — POTASSIUM CHLORIDE 10 MEQ/100ML IV SOLN
10.0000 meq | INTRAVENOUS | Status: AC
  Administered 2019-11-09 (×4): 10 meq via INTRAVENOUS
  Filled 2019-11-09 (×4): qty 100

## 2019-11-09 MED ORDER — SODIUM CHLORIDE 0.9 % IV SOLN
INTRAVENOUS | Status: DC
  Administered 2019-11-09 – 2019-11-10 (×3): via INTRAVENOUS

## 2019-11-09 NOTE — Progress Notes (Signed)
PROGRESS NOTE    Anthony Richmond  ELF:[id number] DOB: [birth year] DOA: 11/06/2019 PCP: Wenda Low, MD     Brief Narrative:  Anthony Richmond is a 73 y.o. male with medical history significant of CVA; HLD; and HTN presenting with dizziness.  He reports that his BP was previously high, but lower during the last year.  He gets dizzy and feels like the "ground's just moving on me."  This morning, he started sweating and didn't feel well.  He called his son and his son called 55.  He did not eat lunch or dinner yesterday, poor PO intake since his CVA in 2015. In the ED, he was found to be hypotensive, with SBP 60 and hypoglycemia with blood sugar 40s. Sepsis work up initiated. He was also found to have AKI with Cr 1.7.    New events last 24 hours / Subjective: Doing well, finished breakfast without issue. No vomiting, diarrhea.   Assessment & Plan:   Principal Problem:   Failure to thrive in adult Active Problems:   Benign hypertension   HLD (hyperlipidemia)   HFrEF (heart failure with reduced ejection fraction) (HCC)   Coronary artery disease involving native coronary artery of native heart without angina pectoris   Depression   Tobacco dependence    Failure to thrive -Patient presented with dizziness, hypotension, hypothermia - initial concern for sepsis but could be related to dehydration and FTT rather than sepsis  -No apparent infectious etiology at this time. Antibiotics stopped for now.  -Blood culture negative to date -Urine cultures negative  -Covid 19 negative  -PT OT dietitian consulted --> home health HT OT rec   AKI vs CKD stage 3a  -Uncertain baseline creatinine -Cr 1.79 --> 1.66 --> 1.54 --> 1.51  Hypokalemia -Replace, trend   Depression -Patient reports that he has been "going downhill" since CVA 5 years ago -His son moved him from his home in [place] to [place] about a year ago -The patient reports depression without active SI -Remeron dose increased   -Stable   HTN -For now will decrease Coreg dose to 3.125 mg BID - likely still needs for CVD protection, but he also had hypotension on presentation  HLD -Continue Pravachol  CAD/HFrEF -Continue ASA/Plavix, coreg, statin  -No ACE due to renal dysfunction and low BPs  Tobacco dependence -Encourage cessation   DVT prophylaxis: Lovenox Code Status: Full Family Communication: None at bedside  Disposition Plan:  Aggressive potassium replacement due to severe hypokalemia. Plan for discharge to ALF/HH   Consultants:   None  Procedures:   None   Antimicrobials:  Anti-infectives (From admission, onward)   Start     Dose/Rate Route Frequency Ordered Stop   11/07/19 1000  vancomycin (VANCOCIN) IVPB 750 mg/150 ml premix  Status:  Discontinued     750 mg 150 mL/hr over 60 Minutes Intravenous Every 24 hours 11/06/19 0558 11/06/19 1636   11/06/19 2200  ceFEPIme (MAXIPIME) 2 g in sodium chloride 0.9 % 100 mL IVPB  Status:  Discontinued     2 g 200 mL/hr over 30 Minutes Intravenous Every 12 hours 11/06/19 0558 11/06/19 1636   11/06/19 0430  ceFEPIme (MAXIPIME) 2 g in sodium chloride 0.9 % 100 mL IVPB     2 g 200 mL/hr over 30 Minutes Intravenous  Once 11/06/19 0420 11/06/19 0634   11/06/19 0430  metroNIDAZOLE (FLAGYL) IVPB 500 mg     500 mg 100 mL/hr over 60 Minutes Intravenous  Once 11/06/19 0420 11/06/19  [id number]   11/06/19 0430  vancomycin (VANCOCIN) IVPB 1000 mg/200 mL premix     1,000 mg 200 mL/hr over 60 Minutes Intravenous  Once 11/06/19 0420 11/06/19 0849       Objective: Vitals:   11/08/19 1351 11/08/19 2127 11/09/19 0548 11/09/19 0842  BP: 104/76 117/63 130/67 134/81  Pulse: 75 77 79 88  Resp: 18 19 (!) 22   Temp: 98 F (36.7 C) 98.4 F (36.9 C) 98.6 F (37 C) 98.4 F (36.9 C)  TempSrc: Oral Oral Oral Oral  SpO2: 100% 98% 97% 100%  Weight:      Height:        Intake/Output Summary (Last 24 hours) at 11/09/2019 1344 Last data filed at 11/09/2019 1316  Gross per 24 hour  Intake 580.09 ml  Output 1730 ml  Net -1149.91 ml   Filed Weights   11/06/19 0429 11/07/19 0407  Weight: 63.5 kg 63.7 kg    Examination: General exam: Appears calm and comfortable  Respiratory system: Clear to auscultation. Respiratory effort normal. Cardiovascular system: S1 & S2 heard, RRR. No pedal edema. Gastrointestinal system: Abdomen is nondistended, soft and nontender. Normal bowel sounds heard. Central nervous system: Alert and oriented. Non focal exam. Speech clear  Extremities: Symmetric in appearance bilaterally  Skin: No rashes, lesions or ulcers on exposed skin  Psychiatry: Judgement and insight appear stable. Mood & affect appropriate.    Data Reviewed: I have personally reviewed following labs and imaging studies  CBC: Recent Labs  Lab 11/06/19 0420 11/07/19 0213 11/08/19 0319  WBC 6.6 6.7 7.0  NEUTROABS 5.5  --   --   HGB 14.8 13.5 13.5  HCT 43.2 38.7* 38.8*  MCV 100.0 97.7 98.7  PLT 135* 137* 131*   Basic Metabolic Panel: Recent Labs  Lab 11/06/19 0420 11/07/19 0213 11/07/19 0952 11/08/19 0319 11/09/19 0227  NA 132* 136  --  138 135  K 3.2* 2.3*  --  2.5* 2.7*  CL 97* 105  --  108 105  CO2 19* 21*  --  21* 22  GLUCOSE 182* 84  --  91 91  BUN 13 14  --  11 9  CREATININE 1.79* 1.66*  --  1.54* 1.51*  CALCIUM 8.9 8.7*  --  8.5* 8.6*  MG  --   --  1.5* 1.6* 1.9   GFR: Estimated Creatinine Clearance: 39.8 mL/min (A) (by C-G formula based on SCr of 1.51 mg/dL (H)). Liver Function Tests: Recent Labs  Lab 11/06/19 0420  AST 26  ALT 19  ALKPHOS 111  BILITOT 1.1  PROT 7.1  ALBUMIN 3.7   No results for input(s): LIPASE, AMYLASE in the last 168 hours. No results for input(s): AMMONIA in the last 168 hours. Coagulation Profile: Recent Labs  Lab 11/06/19 0420  INR 1.0   Cardiac Enzymes: No results for input(s): CKTOTAL, CKMB, CKMBINDEX, TROPONINI in the last 168 hours. BNP (last 3 results) No results for input(s):  PROBNP in the last 8760 hours. HbA1C: No results for input(s): HGBA1C in the last 72 hours. CBG: Recent Labs  Lab 11/06/19 0418 11/06/19 0511 11/06/19 0602 11/06/19 0703  GLUCAP 168* 149* 118* 112*   Lipid Profile: No results for input(s): CHOL, HDL, LDLCALC, TRIG, CHOLHDL, LDLDIRECT in the last 72 hours. Thyroid Function Tests: No results for input(s): TSH, T4TOTAL, FREET4, T3FREE, THYROIDAB in the last 72 hours. Anemia Panel: No results for input(s): VITAMINB12, FOLATE, FERRITIN, TIBC, IRON, RETICCTPCT in the last 72 hours. Sepsis Labs: Recent Labs  Lab 11/06/19 0420 11/06/19 0505 11/06/19 0730 11/06/19 1814 11/07/19 0213 11/08/19 0319  PROCALCITON <0.10  --   --   --  0.21 0.19  LATICACIDVEN  --  3.3* 2.5* 1.7  --   --     Recent Results (from the past 240 hour(s))  Blood Culture (routine x 2)     Status: None (Preliminary result)   Collection Time: 11/06/19  5:00 AM   Specimen: BLOOD RIGHT FOREARM  Result Value Ref Range Status   Specimen Description BLOOD RIGHT FOREARM  Final   Special Requests   Final    BOTTLES DRAWN AEROBIC AND ANAEROBIC Blood Culture results may not be optimal due to an inadequate volume of blood received in culture bottles   Culture   Final    NO GROWTH 3 DAYS Performed at [facility], [address] [place], [place] [address]    Report Status PENDING  Incomplete  Blood Culture (routine x 2)     Status: None (Preliminary result)   Collection Time: 11/06/19  5:05 AM   Specimen: BLOOD RIGHT HAND  Result Value Ref Range Status   Specimen Description BLOOD RIGHT HAND  Final   Special Requests   Final    BOTTLES DRAWN AEROBIC AND ANAEROBIC Blood Culture results may not be optimal due to an inadequate volume of blood received in culture bottles   Culture   Final    NO GROWTH 3 DAYS Performed at [facility], [address] [place], [place] [address]    Report Status PENDING  Incomplete  Urine culture     Status: None    Collection Time: 11/06/19  5:52 AM   Specimen: Urine, Random  Result Value Ref Range Status   Specimen Description URINE, RANDOM  Final   Special Requests NONE  Final   Culture   Final    NO GROWTH Performed at [facility], [address] [place], [place] [address]    Report Status 11/07/2019 FINAL  Final  SARS CORONAVIRUS 2 (TAT 6-24 HRS) Nasopharyngeal Nasopharyngeal Swab     Status: None   Collection Time: 11/06/19  5:58 AM   Specimen: Nasopharyngeal Swab  Result Value Ref Range Status   SARS Coronavirus 2 NEGATIVE NEGATIVE Final    Comment: (NOTE) SARS-CoV-2 target nucleic acids are NOT DETECTED. The SARS-CoV-2 RNA is generally detectable in upper and lower respiratory specimens during the acute phase of infection. Negative results do not preclude SARS-CoV-2 infection, do not rule out co-infections with other pathogens, and should not be used as the sole basis for treatment or other patient management decisions. Negative results must be combined with clinical observations, patient history, and epidemiological information. The expected result is Negative. Fact Sheet for Patients: HairSlick.no Fact Sheet for Healthcare Providers: quierodirigir.com This test is not yet approved or cleared by the [place] FDA and  has been authorized for detection and/or diagnosis of SARS-CoV-2 by FDA under an Emergency Use Authorization (EUA). This EUA will remain  in effect (meaning this test can be used) for the duration of the COVID-19 declaration under Section 56 4(b)(1) of the Act, 21 U.S.C. section 360bbb-3(b)(1), unless the authorization is terminated or revoked sooner. Performed at [facility], [address], [place], [place] [address]       Radiology Studies: No results found.    Scheduled Meds: . aspirin EC  325 mg Oral Daily  . carvedilol  3.125 mg Oral BID WC  .  clopidogrel  75 mg Oral Daily  .  docusate sodium  100 mg Oral BID  . enoxaparin (LOVENOX) injection  40 mg Subcutaneous Q24H  . feeding supplement (ENSURE ENLIVE)  237 mL Oral BID BM  . mirtazapine  15 mg Oral QHS  . multivitamin with minerals  1 tablet Oral Daily  . pantoprazole  40 mg Oral Daily  . pravastatin  40 mg Oral Daily  . sodium chloride flush  3 mL Intravenous Q12H  . tamsulosin  0.4 mg Oral Daily   Continuous Infusions: . sodium chloride 75 mL/hr at 11/09/19 0656  . potassium chloride 10 mEq (11/09/19 1250)     LOS: 3 days      Time spent: 25 minutes   Noralee Stain, DO [facility] 11/09/2019, 1:44 PM   Available via Epic secure chat 7am-7pm After these hours, please refer to coverage provider listed on [email or web address]

## 2019-11-09 NOTE — Progress Notes (Signed)
CRITICAL VALUE ALERT  Critical Value:  Potassium 2.7  Date & Time Notified:  11/09/19 0355  Provider Notified:  Alger Memos, NP  Orders Received/Actions taken:  100cc KCl x6

## 2019-11-09 NOTE — Progress Notes (Signed)
Physical Therapy Treatment Patient Details Name: Anthony Richmond MRN: [id number] DOB: 1946/02/18 Today's Date: 11/09/2019    History of Present Illness 73 y.o. male with medical history significant of CVA; HLD; and HTN presenting with dizziness, hypotension, and hypothermia. Admitted for failure to thrive and dehydration.    PT Comments    Pt performed gt training and functional mobility during session with min guard assistance.  He continues to progress ambulation with minor impairments.  He is on track to return home with HHPT.  Progressed patient to standing exercises this pm.    Follow Up Recommendations  Home health PT;Supervision - Intermittent     Equipment Recommendations  None recommended by PT    Recommendations for Other Services       Precautions / Restrictions Precautions Precautions: Fall Restrictions Weight Bearing Restrictions: No    Mobility  Bed Mobility               General bed mobility comments: Pt seated in recliner on arrival.  Transfers Overall transfer level: Needs assistance Equipment used: Rolling walker (2 wheeled) Transfers: Sit to/from Stand Sit to Stand: Min guard         General transfer comment: Cues for hand placement to push from seated surface, pt reached for RW despute cues.  Ambulation/Gait Ambulation/Gait assistance: Min guard Gait Distance (Feet): 300 Feet Assistive device: Rolling walker (2 wheeled) Gait Pattern/deviations: Step-through pattern;Decreased stride length;Trunk flexed Gait velocity: decreased   General Gait Details: min guard for safety, steady gait, Cues for upper trunk control and RW safety.   Stairs             Wheelchair Mobility    Modified Rankin (Stroke Patients Only)       Balance Overall balance assessment: Needs assistance Sitting-balance support: No upper extremity supported;Feet supported Sitting balance-Leahy Scale: Good     Standing balance support: No upper extremity  supported;During functional activity Standing balance-Leahy Scale: Fair Standing balance comment: static stand without AD support. RW for ambulation                            Cognition Arousal/Alertness: Awake/alert Behavior During Therapy: WFL for tasks assessed/performed Overall Cognitive Status: Within Functional Limits for tasks assessed                                        Exercises General Exercises - Lower Extremity Hip ABduction/ADduction: Both;10 reps;Standing;AROM Hip Flexion/Marching: AROM;Both;10 reps;Standing Heel Raises: AROM;Both;10 reps;Standing Mini-Sqauts: AROM;Both;10 reps;Standing    General Comments        Pertinent Vitals/Pain Pain Assessment: No/denies pain    Home Living                      Prior Function            PT Goals (current goals can now be found in the care plan section) Acute Rehab PT Goals Patient Stated Goal: move back to [place] Potential to Achieve Goals: Good    Frequency    Min 3X/week      PT Plan Current plan remains appropriate    Co-evaluation              AM-PAC PT "6 Clicks" Mobility   Outcome Measure  Help needed turning from your back to your side while in a flat bed without using  bedrails?: None Help needed moving from lying on your back to sitting on the side of a flat bed without using bedrails?: A Little Help needed moving to and from a bed to a chair (including a wheelchair)?: A Little Help needed standing up from a chair using your arms (e.g., wheelchair or bedside chair)?: A Little Help needed to walk in hospital room?: A Little Help needed climbing 3-5 steps with a railing? : A Little 6 Click Score: 19    End of Session Equipment Utilized During Treatment: Gait belt Activity Tolerance: Patient tolerated treatment well Patient left: in bed;with call bell/phone within reach;Other (comment) Nurse Communication: Mobility status PT Visit Diagnosis: Muscle  weakness (generalized) (M62.81)     Time: 1937-9024 PT Time Calculation (min) (ACUTE ONLY): 17 min  Charges:  $Gait Training: 8-22 mins                     Governor Rooks, PTA Acute Rehabilitation Services Pager [phone] Office [phone]     Arica [place] Eli Hose 11/09/2019, 3:51 PM

## 2019-11-09 NOTE — TOC Initial Note (Addendum)
Transition of Care [facility]) - Initial/Assessment Note    Patient Details  Name: Anthony Richmond MRN: [id number] Date of Birth: [birth year]  [facility] CM/SW Contact:    Anthony Richmond, [place] Phone Number: 11/09/2019, 3:05 PM  Clinical Narrative:                 Pt from [place]. No FL2 needed. Pt will return to ILF, spoke with Anthony Richmond at [facility] and she confirmed preferred providers for OT/PT are [facility]. [facility]ed [facility] for services and referral made. Will need orders and face to face prior to dc. Will f/u with pt in regards to transport back to facility when medically stable.   Spoke with pt son Anthony Richmond and confirmed for his information that pt had both Medicare and BCBS supplement present in [facility] system. He agrees with return to ILF with home health and Anthony Richmond also is in contact with [place] at [facility]. CSW provided name and phone number contact if pt son has any additional questions.   Expected Discharge Plan: [place] Barriers to Discharge: Continued Medical Work up   Patient Goals and CMS Choice CMS Medicare.gov Compare Post Acute Care list provided to:: Patient Choice offered to / list presented to : Patient  Expected Discharge Plan and Services Expected Discharge Plan: [place] In-house Referral: Clinical Social Work Discharge Planning Services: CM Consult Post Acute Care Choice: Resumption of Svcs/PTA Provider, Home Health, Durable Medical Equipment Living arrangements for the past 2 months: [place]  HH Arranged: PT, OT [place] Agency: [facility] [employer] [facility] [employer] Date [place]: 11/09/19 Time [place]: 1504 Representative spoke with at [place]: [place] Arrangements/Services Living arrangements for the past 2 months: [place], [place] Lives with:: Facility Resident Patient  language and need for interpreter reviewed:: Yes(no needs) Do you feel safe going back to the place where you live?: Yes      Need for Family Participation in Patient Care: No (Comment) Care giver support system in place?: Yes (comment)(facility resident/ adult children) Current home services: DME Criminal Activity/Legal Involvement Pertinent to Current Situation/Hospitalization: No - Comment as needed  Activities of Daily Living Home Assistive Devices/Equipment: Built-in shower seat ADL Screening (condition at time of admission) Patient's cognitive ability adequate to safely complete daily activities?: Yes Is the patient deaf or have difficulty hearing?: No Does the patient have difficulty seeing, even when wearing glasses/contacts?: No Does the patient have difficulty concentrating, remembering, or making decisions?: No Patient able to express need for assistance with ADLs?: Yes Does the patient have difficulty dressing or bathing?: No Independently performs ADLs?: Yes (appropriate for developmental age) Does the patient have difficulty walking or climbing stairs?: Yes Weakness of Legs: Both Weakness of Arms/Hands: Left  Permission Sought/Granted Permission sought to share information with : Chartered certified accountant granted to share information with : Yes, Verbal Permission Granted     Permission granted to share info w AGENCY: [facility]        Emotional Assessment       Orientation: : Oriented to Self, Oriented to  Time, Oriented to Situation, Oriented to Place Alcohol / Substance Use: Not Applicable Psych Involvement: No (comment)  Admission diagnosis:  Lactic acidosis [E87.2] Hypoglycemia [E16.2] Hypothermia, initial encounter [T68.XXXA] Hypotension, unspecified hypotension type [I95.9] Failure to thrive in adult [R62.7] Patient Active Problem List   Diagnosis  Date Noted  . Failure to thrive in adult 11/06/2019  . Depression 11/06/2019  .  Tobacco dependence 11/06/2019  . HFrEF (heart failure with reduced ejection fraction) (HCC) 10/21/2019  . Coronary artery disease involving native coronary artery of native heart without angina pectoris 10/21/2019  . Ischemic cardiomyopathy 07/15/2019  . NSVT (nonsustained ventricular tachycardia) (HCC) 07/15/2019  . Coronary artery disease without angina pectoris 07/15/2019  . Benign hypertension 07/15/2019  . HLD (hyperlipidemia)   . GERD (gastroesophageal reflux disease)    PCP:  Anthony Housekeeper, MD Pharmacy:   [facility] [id number] - [place], [place] - [address] AT [address] [place] [address] Phone: [phone] Fax: [phone]     Social Determinants of Health (SDOH) Interventions    Readmission Risk Interventions Readmission Risk Prevention Plan 11/09/2019  Post Dischage Appt Complete  Medication Screening Complete  Transportation Screening Complete

## 2019-11-09 NOTE — Care Management Important Message (Signed)
Important Message  Patient Details  Name: Anthony Richmond MRN: [id number] Date of Birth: 04/30/1946   Medicare Important Message Given:  Yes     [name] [address] 11/09/2019, 3:41 PM

## 2019-11-10 LAB — BASIC METABOLIC PANEL
Anion gap: 6 (ref 5–15)
BUN: 11 mg/dL (ref 8–23)
CO2: 24 mmol/L (ref 22–32)
Calcium: 8.8 mg/dL — ABNORMAL LOW (ref 8.9–10.3)
Chloride: 107 mmol/L (ref 98–111)
Creatinine, Ser: 1.36 mg/dL — ABNORMAL HIGH (ref 0.61–1.24)
GFR calc Af Amer: 60 mL/min — ABNORMAL LOW (ref >60)
GFR calc non Af Amer: 52 mL/min — ABNORMAL LOW (ref >60)
Glucose, Bld: 97 mg/dL (ref 70–99)
Potassium: 3.9 mmol/L (ref 3.5–5.1)
Sodium: 137 mmol/L (ref 135–145)

## 2019-11-10 LAB — MAGNESIUM: Magnesium: 1.7 mg/dL (ref 1.7–2.4)

## 2019-11-10 MED ORDER — CARVEDILOL 3.125 MG PO TABS: 3.1250 mg | ORAL_TABLET | Freq: Two times a day (BID) | ORAL | 0 refills | Status: DC

## 2019-11-10 MED ORDER — MIRTAZAPINE 15 MG PO TABS: 15.0000 mg | ORAL_TABLET | Freq: Every day | ORAL | 0 refills | Status: AC

## 2019-11-10 MED FILL — MIRTAZAPINE 15 MG TABLET: 15 | 30 days supply | Qty: 30 | Fill #0

## 2019-11-10 MED FILL — CARVEDILOL 3.125 MG TABLET: 3.125 | 15 days supply | Qty: 30 | Fill #0

## 2019-11-10 NOTE — Discharge Summary (Signed)
Physician Discharge Summary  [name] PYP:[id number] DOB: October 24, 1946 DOA: 11/06/2019  PCP: [name], MD  Admit date: 11/06/2019 Discharge date: 11/10/2019  Admitted From: ALF Disposition:  ALF with home health  Recommendations for Outpatient Follow-up:  1. Follow up with PCP in 1 week 2. Repeat BMP and Mag as outpatient in 1 week   Discharge Condition: Stable CODE STATUS: Full  Diet recommendation: Regular   Brief/Interim Summary: Anthony Richmond a 73 y.o.malewith medical history significant ofCVA; HLD; and HTN presenting with dizziness.He reports that his BP was previously high, but lower during the last year. He gets dizzy and feels like the "ground's just moving on me." This morning, he started sweating and didn't feel well. He called his son and his son called 911. He did not eat lunch or dinner yesterday, poor PO intake since his CVA in 2015. In the ED, he was found to be hypotensive, with SBP 60 and hypoglycemia with blood sugar 40s. Sepsis work up initiated. He was also found to have AKI with Cr 1.7. Work up ultimately did not reveal infectious etiology, but symptoms consistent with failure to thrive. He also had significant electrolyte abnormalities and required few days of IV potassium and IV magnesium infusion.  On day of discharge, he was tolerating his meals and his magnesium and potassium levels were within normal limits.  He is discharged back to assisted living facility with home health therapies.    Discharge Diagnoses:  Principal Problem:   Failure to thrive in adult Active Problems:   Benign hypertension   HLD (hyperlipidemia)   HFrEF (heart failure with reduced ejection fraction) (HCC)   Coronary artery disease involving native coronary artery of native heart without angina pectoris   Depression   Tobacco dependence    Failure to thrive -Patient presented with dizziness, hypotension, hypothermia - initial concern for sepsis but could be  related to dehydration and FTT rather than sepsis  -No apparent infectious etiology at this time. Antibiotics stopped for now.  -Blood culture negative to date -Urine cultures negative  -Covid 19 negative  -PT OT dietitian consulted --> home health HT OT rec   AKI on CKD stage 3a  -Uncertain baseline creatinine -Cr 1.79 --> 1.66 --> 1.54 --> 1.51--> 1.36   Hypokalemia -Improved   Hypomagnesemia -Improved   Depression -Patient reports that he has been "going downhill" since CVA 5 years ago -His son moved him from his home in [place] to [place] about a year ago -The patient reports depression without active SI -Remeron dose increased  -Stable   HTN -For now will decrease Coreg dose to 3.125 mg BID - likely still needs for CVD protection, but he also had hypotension on presentation  HLD -Continue Pravachol  CAD/HFrEF -Continue ASA/Plavix, coreg, statin  -No ACE due to renal dysfunction and low BPs  Tobacco dependence -Encourage cessation   Discharge Instructions  Discharge Instructions    Call MD for:  difficulty breathing, headache or visual disturbances   Complete by: As directed    Call MD for:  extreme fatigue   Complete by: As directed    Call MD for:  persistant dizziness or light-headedness   Complete by: As directed    Call MD for:  persistant nausea and vomiting   Complete by: As directed    Call MD for:  severe uncontrolled pain   Complete by: As directed    Call MD for:  temperature >100.4   Complete by: As directed  Diet general   Complete by: As directed    Discharge instructions   Complete by: As directed    You were cared for by a hospitalist during your hospital stay. If you have any questions about your discharge medications or the care you received while you were in the hospital after you are discharged, you can call the unit and ask to speak with the hospitalist on call if the hospitalist that took care of you is not available. Once  you are discharged, your primary care physician will handle any further medical issues. Please note that NO REFILLS for any discharge medications will be authorized once you are discharged, as it is imperative that you return to your primary care physician (or establish a relationship with a primary care physician if you do not have one) for your aftercare needs so that they can reassess your need for medications and monitor your lab values.   Increase activity slowly   Complete by: As directed      Allergies as of 11/10/2019   No Known Allergies     Medication List    TAKE these medications   aspirin EC 81 MG tablet Take 81 mg by mouth daily.   carvedilol 3.125 MG tablet Commonly known as: COREG Take 1 tablet (3.125 mg total) by mouth 2 (two) times daily with a meal. What changed:   medication strength  how much to take   clopidogrel 75 MG tablet Commonly known as: PLAVIX Take 75 mg by mouth daily.   mirtazapine 15 MG tablet Commonly known as: REMERON Take 1 tablet (15 mg total) by mouth at bedtime. What changed:   medication strength  how much to take   omeprazole 20 MG capsule Commonly known as: PRILOSEC Take 20 mg by mouth daily.   pravastatin 40 MG tablet Commonly known as: PRAVACHOL Take 40 mg by mouth daily.   tamsulosin 0.4 MG Caps capsule Commonly known as: FLOMAX Take 0.4 mg by mouth daily.      Follow-up Information    [name], MD. Schedule an appointment as soon as possible for a visit in 1 week(s).   Specialty: Internal Medicine Contact information: [address] [place] [phone]          No Known Allergies  Consultations:  None   Procedures/Studies: Dg Chest Port 1 View  Result Date: 11/06/2019 CLINICAL DATA:  Hypotension. EXAM: PORTABLE CHEST 1 VIEW COMPARISON:  None. FINDINGS: There is no large pleural effusion. The heart size is normal. The patient is status post prior median sternotomy.  A loop recorder is noted. There is no pneumothorax. IMPRESSION: No active disease. Electronically Signed   By: Katherine Mantle M.D.   On: 11/06/2019 04:50      Discharge Exam: Vitals:   11/10/19 0523 11/10/19 0923  BP: 138/75 112/69  Pulse: 81 85  Resp: 16   Temp: 97.9 F (36.6 C) 98.9 F (37.2 C)  SpO2: 99% 100%     General: Pt is alert, awake, not in acute distress Cardiovascular: RRR, S1/S2 +, no edema Respiratory: CTA bilaterally, no wheezing, no rhonchi, no respiratory distress, no conversational dyspnea  Abdominal: Soft, NT, ND, bowel sounds + Extremities: no edema, no cyanosis Psych: Normal mood and affect, stable judgement and insight     The results of significant diagnostics from this hospitalization (including imaging, microbiology, ancillary and laboratory) are listed below for reference.     Microbiology: Recent Results (from the past 240 hour(s))  Blood Culture (routine x 2)     Status: None (Preliminary result)   Collection Time: 11/06/19  5:00 AM   Specimen: BLOOD RIGHT FOREARM  Result Value Ref Range Status   Specimen Description BLOOD RIGHT FOREARM  Final   Special Requests   Final    BOTTLES DRAWN AEROBIC AND ANAEROBIC Blood Culture results may not be optimal due to an inadequate volume of blood received in culture bottles   Culture   Final    NO GROWTH 4 DAYS Performed at [facility], [address] [place], [place] [address]    Report Status PENDING  Incomplete  Blood Culture (routine x 2)     Status: None (Preliminary result)   Collection Time: 11/06/19  5:05 AM   Specimen: BLOOD RIGHT HAND  Result Value Ref Range Status   Specimen Description BLOOD RIGHT HAND  Final   Special Requests   Final    BOTTLES DRAWN AEROBIC AND ANAEROBIC Blood Culture results may not be optimal due to an inadequate volume of blood received in culture bottles   Culture   Final    NO GROWTH 4 DAYS Performed at [facility], [address]  [place], [place] [address]    Report Status PENDING  Incomplete  Urine culture     Status: None   Collection Time: 11/06/19  5:52 AM   Specimen: Urine, Random  Result Value Ref Range Status   Specimen Description URINE, RANDOM  Final   Special Requests NONE  Final   Culture   Final    NO GROWTH Performed at [facility], [address] [place], [place] [address]    Report Status 11/07/2019 FINAL  Final  SARS CORONAVIRUS 2 (TAT 6-24 HRS) Nasopharyngeal Nasopharyngeal Swab     Status: None   Collection Time: 11/06/19  5:58 AM   Specimen: Nasopharyngeal Swab  Result Value Ref Range Status   SARS Coronavirus 2 NEGATIVE NEGATIVE Final    Comment: (NOTE) SARS-CoV-2 target nucleic acids are NOT DETECTED. The SARS-CoV-2 RNA is generally detectable in upper and lower respiratory specimens during the acute phase of infection. Negative results do not preclude SARS-CoV-2 infection, do not rule out co-infections with other pathogens, and should not be used as the sole basis for treatment or other patient management decisions. Negative results must be combined with clinical observations, patient history, and epidemiological information. The expected result is Negative. Fact Sheet for Patients: HairSlick.nohttps://www.fda.gov/media/138098/download Fact Sheet for Healthcare Providers: quierodirigir.comhttps://www.fda.gov/media/138095/download This test is not yet approved or cleared by the [place] FDA and  has been authorized for detection and/or diagnosis of SARS-CoV-2 by FDA under an Emergency Use Authorization (EUA). This EUA will remain  in effect (meaning this test can be used) for the duration of the COVID-19 declaration under Section 56 4(b)(1) of the Act, 21 U.S.C. section 360bbb-3(b)(1), unless the authorization is terminated or revoked sooner. Performed at [facility], [address] [place], [place] [address]      Labs: BNP (last 3 results) No results for input(s): BNP in the last 8760  hours. Basic Metabolic Panel: Recent Labs  Lab 11/06/19 0420 11/07/19 0213 11/07/19 0952 11/08/19 0319 11/09/19 0227 11/09/19 1501 11/10/19 0237  NA 132* 136  --  138 135  --  137  K 3.2* 2.3*  --  2.5* 2.7* 3.8 3.9  CL 97* 105  --  108 105  --  107  CO2 19* 21*  --  21*  22  --  24  GLUCOSE 182* 84  --  91 91  --  97  BUN 13 14  --  11 9  --  11  CREATININE 1.79* 1.66*  --  1.54* 1.51*  --  1.36*  CALCIUM 8.9 8.7*  --  8.5* 8.6*  --  8.8*  MG  --   --  1.5* 1.6* 1.9  --  1.7   Liver Function Tests: Recent Labs  Lab 11/06/19 0420  AST 26  ALT 19  ALKPHOS 111  BILITOT 1.1  PROT 7.1  ALBUMIN 3.7   No results for input(s): LIPASE, AMYLASE in the last 168 hours. No results for input(s): AMMONIA in the last 168 hours. CBC: Recent Labs  Lab 11/06/19 0420 11/07/19 0213 11/08/19 0319  WBC 6.6 6.7 7.0  NEUTROABS 5.5  --   --   HGB 14.8 13.5 13.5  HCT 43.2 38.7* 38.8*  MCV 100.0 97.7 98.7  PLT 135* 137* 131*   Cardiac Enzymes: No results for input(s): CKTOTAL, CKMB, CKMBINDEX, TROPONINI in the last 168 hours. BNP: Invalid input(s): POCBNP CBG: Recent Labs  Lab 11/06/19 0418 11/06/19 0511 11/06/19 0602 11/06/19 0703  GLUCAP 168* 149* 118* 112*   D-Dimer No results for input(s): DDIMER in the last 72 hours. Hgb A1c No results for input(s): HGBA1C in the last 72 hours. Lipid Profile No results for input(s): CHOL, HDL, LDLCALC, TRIG, CHOLHDL, LDLDIRECT in the last 72 hours. Thyroid function studies No results for input(s): TSH, T4TOTAL, T3FREE, THYROIDAB in the last 72 hours.  Invalid input(s): FREET3 Anemia work up No results for input(s): VITAMINB12, FOLATE, FERRITIN, TIBC, IRON, RETICCTPCT in the last 72 hours. Urinalysis    Component Value Date/Time   COLORURINE STRAW (A) 11/06/2019 0420   APPEARANCEUR CLEAR 11/06/2019 0420   LABSPEC 1.004 (L) 11/06/2019 0420   PHURINE 7.0 11/06/2019 0420   GLUCOSEU 50 (A) 11/06/2019 0420   HGBUR SMALL (A)  11/06/2019 0420   BILIRUBINUR NEGATIVE 11/06/2019 0420   KETONESUR 5 (A) 11/06/2019 0420   PROTEINUR 30 (A) 11/06/2019 0420   NITRITE NEGATIVE 11/06/2019 0420   LEUKOCYTESUR NEGATIVE 11/06/2019 0420   Sepsis Labs Invalid input(s): PROCALCITONIN,  WBC,  LACTICIDVEN Microbiology Recent Results (from the past 240 hour(s))  Blood Culture (routine x 2)     Status: None (Preliminary result)   Collection Time: 11/06/19  5:00 AM   Specimen: BLOOD RIGHT FOREARM  Result Value Ref Range Status   Specimen Description BLOOD RIGHT FOREARM  Final   Special Requests   Final    BOTTLES DRAWN AEROBIC AND ANAEROBIC Blood Culture results may not be optimal due to an inadequate volume of blood received in culture bottles   Culture   Final    NO GROWTH 4 DAYS Performed at [facility], [place] [address] [place], [place] [address]    Report Status PENDING  Incomplete  Blood Culture (routine x 2)     Status: None (Preliminary result)   Collection Time: 11/06/19  5:05 AM   Specimen: BLOOD RIGHT HAND  Result Value Ref Range Status   Specimen Description BLOOD RIGHT HAND  Final   Special Requests   Final    BOTTLES DRAWN AEROBIC AND ANAEROBIC Blood Culture results may not be optimal due to an inadequate volume of blood received in culture bottles   Culture   Final    NO GROWTH 4 DAYS Performed at [facility], [place] [address] [place], [place] [address]  Report Status PENDING  Incomplete  Urine culture     Status: None   Collection Time: 11/06/19  5:52 AM   Specimen: Urine, Random  Result Value Ref Range Status   Specimen Description URINE, RANDOM  Final   Special Requests NONE  Final   Culture   Final    NO GROWTH Performed at [facility], [address] [place], [place] [address]    Report Status 11/07/2019 FINAL  Final  SARS CORONAVIRUS 2 (TAT 6-24 HRS) Nasopharyngeal Nasopharyngeal Swab     Status: None   Collection Time: 11/06/19  5:58 AM   Specimen: Nasopharyngeal Swab   Result Value Ref Range Status   SARS Coronavirus 2 NEGATIVE NEGATIVE Final    Comment: (NOTE) SARS-CoV-2 target nucleic acids are NOT DETECTED. The SARS-CoV-2 RNA is generally detectable in upper and lower respiratory specimens during the acute phase of infection. Negative results do not preclude SARS-CoV-2 infection, do not rule out co-infections with other pathogens, and should not be used as the sole basis for treatment or other patient management decisions. Negative results must be combined with clinical observations, patient history, and epidemiological information. The expected result is Negative. Fact Sheet for Patients: HairSlick.no Fact Sheet for Healthcare Providers: quierodirigir.com This test is not yet approved or cleared by the [place] FDA and  has been authorized for detection and/or diagnosis of SARS-CoV-2 by FDA under an Emergency Use Authorization (EUA). This EUA will remain  in effect (meaning this test can be used) for the duration of the COVID-19 declaration under Section 56 4(b)(1) of the Act, 21 U.S.C. section 360bbb-3(b)(1), unless the authorization is terminated or revoked sooner. Performed at [facility], [address] [place], [place] [address]      Patient was seen and examined on the day of discharge and was found to be in stable condition. Time coordinating discharge: 40 minutes including assessment and coordination of care, as well as examination of the patient.   SIGNED:  Noralee Stain, DO [facility] 11/10/2019, 9:58 AM

## 2019-11-10 NOTE — Progress Notes (Signed)
Occupational Therapy Treatment Patient Details Name: MORDECHAI MATUSZAK MRN: [id number] DOB: 1946-03-31 Today's Date: 11/10/2019    History of present illness 73 y.o. male with medical history significant of CVA; HLD; and HTN presenting with dizziness, hypotension, and hypothermia. Admitted for failure to thrive and dehydration.   OT comments  Patient semi-supine in bed upon arrival, pleasant and agreeable to OT. Patient require min cues for body mechanics to push from edge of bed vs pulling on walker to stand. Patient participate in sink side grooming top wash his hair, face at supervision level. After brief seated rest break patient participate in functional ambulation using rolling walker. Patient able to navigate obstacles in hallway safely and recall which room he is in without cues.   Follow Up Recommendations  Home health OT;Supervision - Intermittent    Equipment Recommendations  None recommended by OT       Precautions / Restrictions Precautions Precautions: Fall Restrictions Weight Bearing Restrictions: No       Mobility Bed Mobility Overal bed mobility: Modified Independent             General bed mobility comments: HOB elevated  Transfers Overall transfer level: Needs assistance Equipment used: Rolling walker (2 wheeled) Transfers: Sit to/from Stand Sit to Stand: Min guard         General transfer comment: verbal cues to push from surface vs pulling on walker frame. patient return demo proper technique    Balance Overall balance assessment: Needs assistance Sitting-balance support: No upper extremity supported;Feet supported Sitting balance-Leahy Scale: Good     Standing balance support: Bilateral upper extremity supported;During functional activity Standing balance-Leahy Scale: Fair Standing balance comment: static stand without AD support. RW for ambulation                           ADL either performed or assessed with clinical judgement    ADL Overall ADL's : Needs assistance/impaired     Grooming: Supervision/safety;Standing;Wash/dry face(wash hair) Grooming Details (indicate cue type and reason): completed ADL at sink level                 Toilet Transfer: Supervision/safety;Ambulation;RW Toilet Transfer Details (indicate cue type and reason): ambulated to toilet, supervision in standing to urinate Toileting- Clothing Manipulation and Hygiene: Supervision/safety;Sit to/from stand Toileting - Clothing Manipulation Details (indicate cue type and reason): managing gown     Functional mobility during ADLs: Supervision/safety;Rolling walker                 Cognition Arousal/Alertness: Awake/alert Behavior During Therapy: WFL for tasks assessed/performed Overall Cognitive Status: Within Functional Limits for tasks assessed                                                     Pertinent Vitals/ Pain       Pain Assessment: No/denies pain         Frequency  Min 2X/week        Progress Toward Goals  OT Goals(current goals can now be found in the care plan section)  Progress towards OT goals: Progressing toward goals  Acute Rehab OT Goals Patient Stated Goal: to leave today OT Goal Formulation: With patient Time For Goal Achievement: 11/21/19 Potential to Achieve Goals: Good ADL Goals Pt Will Perform Grooming: with modified independence;standing  Pt Will Perform Upper Body Dressing: with modified independence;standing Pt Will Perform Lower Body Dressing: with modified independence;sit to/from stand Pt Will Transfer to Toilet: with modified independence;ambulating Pt Will Perform Toileting - Clothing Manipulation and hygiene: with modified independence;sit to/from stand  Plan Discharge plan remains appropriate       AM-PAC OT "6 Clicks" Daily Activity     Outcome Measure   Help from another person eating meals?: None Help from another person taking care of personal  grooming?: A Little Help from another person toileting, which includes using toliet, bedpan, or urinal?: A Little Help from another person bathing (including washing, rinsing, drying)?: A Little Help from another person to put on and taking off regular upper body clothing?: A Little Help from another person to put on and taking off regular lower body clothing?: A Little 6 Click Score: 19    End of Session Equipment Utilized During Treatment: Rolling walker  OT Visit Diagnosis: Unsteadiness on feet (R26.81);Other abnormalities of gait and mobility (R26.89);History of falling (Z91.81)   Activity Tolerance Patient tolerated treatment well   Patient Left in chair;with call bell/phone within reach   Nurse Communication Mobility status        Time: 7124-5809 OT Time Calculation (min): 26 min  Charges: OT General Charges $OT Visit: 1 Visit OT Treatments $Self Care/Home Management : 23-37 mins  Myrtie Neither OT OT office: [phone]   Carmelia Roller 11/10/2019, 8:54 AM

## 2019-11-10 NOTE — TOC Transition Note (Addendum)
Transition of Care [facility]) - CM/SW Discharge Note   Patient Details  Name: Anthony Richmond MRN: [id number] Date of Birth: 1946-11-02  [facility] of [facility] CM/SW Contact:  [name], [place] Phone Number: 11/10/2019, 10:42 AM   Clinical Narrative:    12:23pm- CSW reached pt son [name] and he would like to transport pt back to [facility]. This has been approved as okay by [employer]. CSW has also alerted pt RN.   10:42am- Pt set up with [facility] through [facility] (confirmed dc today with Tiffany at [facility]). Confirmed with Melissa at [facility] that pt good to return today, they are aware and ready. Message left with pt son [name] to see if he prefers to transport or if he would like PTAR.    Final next level of care: [place] Barriers to Discharge: Barriers Resolved   Patient Goals and CMS Choice Patient states their goals for this hospitalization and ongoing recovery are:: return to [place] CMS Medicare.gov Compare Post Acute Care list provided to:: Other (Comment Required)(n/a) Choice offered to / list presented to : Patient, Adult Children  Discharge Placement              Patient chooses bed at: [facility]) Patient to be transferred to facility by: pt son Name of family member notified: pt son [name] Patient and family notified of of transfer: 11/10/19  Discharge Plan and Services In-house Referral: Clinical Social Work Discharge Planning Services: [employer] Consult Post Acute Care Choice: Resumption of Svcs/PTA Provider, Home Health, Durable Medical Equipment     HH Arranged: OT, PT [place] Agency: [facility] [employer]) Date [place]: 11/09/19 Time [place]: 1504 Representative spoke with at [place]: [place] ([place]) Interventions     Readmission Risk Interventions Readmission Risk Prevention Plan 11/09/2019  Post Dischage Appt Complete  Medication Screening  Complete  Transportation Screening Complete

## 2019-11-10 NOTE — Progress Notes (Signed)
Patient was discharged To [place] accompanied by son [name]; discharged instructions  review and give to patient with care notes and prescriptions; VS stable and not in pain, IV DIC, old scrapes noted on bilateral shin otherwise skin is ok; patient will be escorted to the car by nurse tech via wheelchair.

## 2019-11-11 LAB — CULTURE, BLOOD (ROUTINE X 2)
Culture: NO GROWTH
Culture: NO GROWTH

## 2019-11-12 ENCOUNTER — Other Ambulatory Visit: Payer: Self-pay

## 2019-11-12 ENCOUNTER — Ambulatory Visit (INDEPENDENT_AMBULATORY_CARE_PROVIDER_SITE_OTHER): Payer: Medicare Other | Admitting: Cardiology

## 2019-11-12 VITALS — BP 128/63 | HR 87 | Temp 98.0°F | Ht 69.0 in | Wt 136.0 lb

## 2019-11-12 DIAGNOSIS — R627 Adult failure to thrive: Secondary | ICD-10-CM | POA: Diagnosis not present

## 2019-11-12 DIAGNOSIS — I2510 Atherosclerotic heart disease of native coronary artery without angina pectoris: Secondary | ICD-10-CM | POA: Diagnosis not present

## 2019-11-12 DIAGNOSIS — I5020 Unspecified systolic (congestive) heart failure: Secondary | ICD-10-CM

## 2019-11-12 MED ORDER — CARVEDILOL 3.125 MG PO TABS: 3.1250 mg | ORAL_TABLET | Freq: Two times a day (BID) | ORAL | 0 refills | Status: DC

## 2019-11-12 MED ORDER — CLOPIDOGREL BISULFATE 75 MG PO TABS: 75.0000 mg | ORAL_TABLET | Freq: Every day | ORAL | 3 refills | Status: DC

## 2019-11-12 NOTE — Progress Notes (Signed)
Follow up visit  Subjective:   Anthony Richmond, male    DOB: November 14, 1946, 73 y.o.   MRN: [id number]   Chief Complaint  Patient presents with  . Cardiomyopathy  . Follow-up    2 week     HPI  73 y/o caucasian male with coronary arery disease s/p multiple prior stents, ischemic cardiomyopathy (EF 35-40%), stroke in 2015 with mild residual left sided deficit, hypertension, tobacco abuse.  On 10/22, I recommended stopping lisinopril and starting Entresto at low-dose 24/26 mg twice daily 2 days later. Patient was admitted to [facility] between 11/06/2019-11/10/2019 with complaints of dizziness.  Patient was found to be hypotensive with systolic blood pressure in the 60s and hypoglycemia with blood sugar in 40s.  Sepsis work-up was unremarkable.  He did have acute kidney injury with creatinine increased to 1.7, and potassium down to as low as 2.3.  His presentation was thought to be secondary to dehydration and failure to thrive.  On discharge, his creatinine and potassium had improved to 1.36, and 3.9 respectively.  Carvedilol dose was decreased to 3.125 mg twice daily..  Patient is here for follow-up today, accompanied by son.  On specific questioning, patient endorses decreased dietary intake.  He attributes this to limited times of meals served at his assisted living facility, and his avoidance to be around with too many people.  However, he is trying to eat better since his discharge from the hospital.  He is feeling back to his baseline.  He denies any chest pain, shortness of breath, lightheadedness, generalized fatigue.   Past Medical History:  Diagnosis Date  . Benign hypertension 07/15/2019  . Coronary artery disease without angina pectoris 07/15/2019  . CVA (cerebral vascular accident) ([place]) 2015  . Dehydration 11/06/2019  . GERD (gastroesophageal reflux disease)   . HLD (hyperlipidemia)   . Ischemic cardiomyopathy 07/15/2019  . NSVT (nonsustained ventricular tachycardia)  ([place]) 07/15/2019  . Personal history of transient ischemic attack      Past Surgical History:  Procedure Laterality Date  . CORONARY ARTERY BYPASS GRAFT  1995   [place] [facility]  . HIP SURGERY       Social History   Socioeconomic History  . Marital status: Divorced    Spouse name: Not on file  . Number of children: 1  . Years of education: Not on file  . Highest education level: Not on file  Occupational History  . Occupation: retired  Scientific laboratory technician  . Financial resource strain: Not on file  . Food insecurity    Worry: Not on file    Inability: Not on file  . Transportation needs    Medical: Not on file    Non-medical: Not on file  Tobacco Use  . Smoking status: Current Every Day Smoker    Packs/day: 0.50    Years: 50.00    Pack years: 25.00    Types: Cigarettes    Start date: 87  . Smokeless tobacco: Never Used  . Tobacco comment: Quit 1995 but started back  Substance and Sexual Activity  . Alcohol use: Yes    Comment: 2 drinks/day  . Drug use: Not Currently  . Sexual activity: Not on file  Lifestyle  . Physical activity    Days per week: Not on file    Minutes per session: Not on file  . Stress: Not on file  Relationships  . Social Herbalist on phone: Not on file    Gets together: Not  on file    Attends religious service: Not on file    Active member of club or organization: Not on file    Attends meetings of clubs or organizations: Not on file    Relationship status: Not on file  . Intimate partner violence    Fear of current or ex partner: Not on file    Emotionally abused: Not on file    Physically abused: Not on file    Forced sexual activity: Not on file  Other Topics Concern  . Not on file  Social History Narrative  . Not on file     Family History  Problem Relation Age of Onset  . Heart attack Father      Current Outpatient Medications on File Prior to Visit  Medication Sig Dispense Refill  . aspirin EC 81 MG tablet Take  81 mg by mouth daily.     . carvedilol (COREG) 3.125 MG tablet Take 1 tablet (3.125 mg total) by mouth 2 (two) times daily with a meal. 30 tablet 0  . clopidogrel (PLAVIX) 75 MG tablet Take 75 mg by mouth daily.    . mirtazapine (REMERON) 15 MG tablet Take 1 tablet (15 mg total) by mouth at bedtime. 30 tablet 0  . omeprazole (PRILOSEC) 20 MG capsule Take 20 mg by mouth daily.    . pravastatin (PRAVACHOL) 40 MG tablet Take 40 mg by mouth daily.    . tamsulosin (FLOMAX) 0.4 MG CAPS capsule Take 0.4 mg by mouth daily.     No current facility-administered medications on file prior to visit.     Cardiovascular studies:  EKG 10/22/2019:  Sinus rhythm 80 bpm. Anteroseptal infarct, old. IVCD.   ST-T changes related to IVCD.   Echocardiogram 02/24/2019: Left ventricle cavity is normal in size. Mild global and severe anterior/anteroseptal hypokinesis. Visual LVEF 35-40%.  Grade II diastolic dysfunction with elevated LAP.  Left atrial cavity is mildly dilated. Mild aortic stenosis. Moderate (Grade III) mitral regurgitation. Mild tricuspid regurgitation.  No evidence of pulmonary hypertension.  Coronary angiography 2016: NSTEMI PCI to SVG-PDA for severe ISR Resolute Integrity 3.5X38 mm & 3.5X22 mm DES, post dilated to 4.0 mm  Staged PCI on 06/25 & 06/28: PCI to [place] 2.25 X 12 mm DES LM Bifurcation stenting: Xience Expedition 2.5X12 mm, 2.5 X 15 mm, and 3.5 X 8 m DES, complicated by stent dislodgement and non-deployement in left main trunk. LM Xience Expedition 4.0 X 12 mm DES jailing the dislodged stent against legt main trunk wall. Post dilatation with 2.5 kissing balloons, and 4.5 mm balloon in left main. PCI to [place] 3.5X 12 mm DES  Recent labs: 11/10/2019: Glucose 97.  BUN/creatinine 11/1.36.  EGFR 52.  Sodium 137, potassium 3.9.  11/07/2019: Glucose 84.  BUN/creatinine 14/1.66.  EGFR 41.  Sodium 136, potassium 2.3. H/H 13.5/30.8.  MCV 98.   Platelets 131. Lactic acid 1.7  11/06/2019: Lactic acid 2.5    Review of Systems  Constitution: Negative for decreased appetite, malaise/fatigue, weight gain and weight loss.  HENT: Negative for congestion.   Eyes: Negative for visual disturbance.  Cardiovascular: Negative for chest pain, dyspnea on exertion, leg swelling, palpitations and syncope.  Respiratory: Negative for cough and shortness of breath.   Endocrine: Negative for cold intolerance.  Hematologic/Lymphatic: Does not bruise/bleed easily.  Skin: Negative for itching and rash.  Musculoskeletal: Negative for myalgias.  Gastrointestinal: Negative for abdominal pain, nausea and vomiting.  Genitourinary: Negative for dysuria.  Neurological: Negative  for dizziness and weakness.  Psychiatric/Behavioral: The patient is not nervous/anxious.   All other systems reviewed and are negative.        Vitals:   11/12/19 1446  BP: 128/63  Pulse: 87  Temp: 98 F (36.7 C)  SpO2: 100%     Body mass index is 20.08 kg/m. Filed Weights   11/12/19 1446  Weight: 136 lb (61.7 kg)     Objective:   Physical Exam  Constitutional: He is oriented to person, place, and time. He appears well-developed. No distress.  General frailty  HENT:  Head: Normocephalic and atraumatic.  Eyes: Pupils are equal, round, and reactive to light. Conjunctivae are normal.  Neck: No JVD present.  Cardiovascular: Normal rate, regular rhythm and intact distal pulses.  Pulmonary/Chest: Effort normal and breath sounds normal. He has no wheezes. He has no rales.  Abdominal: Soft. Bowel sounds are normal. There is no rebound.  Musculoskeletal:        General: No edema.  Lymphadenopathy:    He has no cervical adenopathy.  Neurological: He is alert and oriented to person, place, and time. No cranial nerve deficit.  Mild left sided weakness, stable   Skin: Skin is warm and dry.  Psychiatric: He has a normal mood and affect.  Nursing note and vitals  reviewed.       Assessment & Recommendations:   73 y/o caucasian male with coronary arery disease s/p multiple prior stents, ischemic cardiomyopathy (EF 35-40%), stroke in 2015 with mild residual left sided deficit, hypertension, tobacco abuse.  HFrEF: Ischemic cardiomyopathy, EF 35-40% (01/2019), likely causing moderate MR with cardiomyopathy.  Clinically compensated, NYHA class II symptoms. Given recent hospital admission with dehydration and hypotension, I would recommend conservative management for now. Continue carvedilol at low dose 3.125 mg bid. Will hold off ACEi/ARNI at this time.  Encouraged improved hydration and nutrition.  Coronary artery disease: Multiple prior PCI's. Currently, no angina symptoms. Recommend plavix 75 mg daily, without Aspirin. Continue crestor 20 mg daily.  H/o CVA: Mild left sided neurodeficit.  F/u in 4 weeks.    Nigel Mormon, MD [facility] Pager: [phone] Office: [phone] If no answer Cell [phone]

## 2019-11-13 ENCOUNTER — Encounter: Payer: Self-pay | Admitting: Cardiology

## 2019-12-02 ENCOUNTER — Other Ambulatory Visit: Payer: Self-pay | Admitting: Cardiology

## 2019-12-02 DIAGNOSIS — I5020 Unspecified systolic (congestive) heart failure: Secondary | ICD-10-CM

## 2019-12-11 ENCOUNTER — Ambulatory Visit (INDEPENDENT_AMBULATORY_CARE_PROVIDER_SITE_OTHER): Payer: Medicare Other | Admitting: Cardiology

## 2019-12-11 ENCOUNTER — Other Ambulatory Visit: Payer: Self-pay

## 2019-12-11 ENCOUNTER — Encounter: Payer: Self-pay | Admitting: Cardiology

## 2019-12-11 VITALS — BP 124/66 | HR 94 | Temp 97.1°F | Ht 69.0 in | Wt 155.2 lb

## 2019-12-11 DIAGNOSIS — R600 Localized edema: Secondary | ICD-10-CM | POA: Diagnosis not present

## 2019-12-11 DIAGNOSIS — I2510 Atherosclerotic heart disease of native coronary artery without angina pectoris: Secondary | ICD-10-CM | POA: Diagnosis not present

## 2019-12-11 MED ORDER — FUROSEMIDE 20 MG PO TABS: 20.0000 mg | ORAL_TABLET | Freq: Every day | ORAL | 3 refills | Status: DC | PRN

## 2019-12-11 NOTE — Progress Notes (Signed)
Follow up visit  Subjective:   Anthony Richmond, male    DOB: 1946/03/26, 73 y.o.   MRN: [id number]   Chief Complaint  Patient presents with  . Congestive Heart Failure  . Follow-up    73 y/o caucasian male with coronary arery disease s/p multiple prior stents, ischemic cardiomyopathy (EF 35-40%), stroke in 2015 with mild residual left sided deficit, hypertension, tobacco abuse.  On 10/22, I recommended stopping lisinopril and starting Entresto at low-dose 24/26 mg twice daily 2 days later. Patient was admitted to [facility] between 11/06/2019-11/10/2019 with complaints of dizziness.  Patient was found to be hypotensive with systolic blood pressure in the 60s and hypoglycemia with blood sugar in 40s.  Sepsis work-up was unremarkable.  He did have acute kidney injury with creatinine increased to 1.7, and potassium down to as low as 2.3.  His presentation was thought to be secondary to dehydration and failure to thrive.  On discharge, his creatinine and potassium had improved to 1.36, and 3.9 respectively.  Carvedilol dose was decreased to 3.125 mg twice daily.[name]  He has since increased his dietary intake and generally feeling well. He denies any exertional dyspnea.    Past Medical History:  Diagnosis Date  . Benign hypertension 07/15/2019  . Coronary artery disease without angina pectoris 07/15/2019  . CVA (cerebral vascular accident) ([place]) 2015  . Dehydration 11/06/2019  . GERD (gastroesophageal reflux disease)   . HLD (hyperlipidemia)   . Ischemic cardiomyopathy 07/15/2019  . NSVT (nonsustained ventricular tachycardia) ([place]) 07/15/2019  . Personal history of transient ischemic attack      Past Surgical History:  Procedure Laterality Date  . CORONARY ARTERY BYPASS GRAFT  1995   [place] [facility]  . HIP SURGERY       Social History   Socioeconomic History  . Marital status: Divorced    Spouse name: Not on file  . Number of children: 1  . Years of education: Not on file    . Highest education level: Not on file  Occupational History  . Occupation: retired  Tobacco Use  . Smoking status: Current Every Day Smoker    Packs/day: 0.50    Years: 50.00    Pack years: 25.00    Types: Cigarettes    Start date: 36  . Smokeless tobacco: Never Used  . Tobacco comment: Quit 1995 but started back  Substance and Sexual Activity  . Alcohol use: Yes    Comment: 2 drinks/day  . Drug use: Not Currently  . Sexual activity: Not on file  Other Topics Concern  . Not on file  Social History Narrative  . Not on file   Social Determinants of Health   Financial Resource Strain:   . Difficulty of Paying Living Expenses: Not on file  Food Insecurity:   . Worried About Charity fundraiser in the Last Year: Not on file  . Ran Out of Food in the Last Year: Not on file  Transportation Needs:   . Lack of Transportation (Medical): Not on file  . Lack of Transportation (Non-Medical): Not on file  Physical Activity:   . Days of Exercise per Week: Not on file  . Minutes of Exercise per Session: Not on file  Stress:   . Feeling of Stress : Not on file  Social Connections:   . Frequency of Communication with Friends and Family: Not on file  . Frequency of Social Gatherings with Friends and Family: Not on file  . Attends Religious  Services: Not on file  . Active Member of Clubs or Organizations: Not on file  . Attends Archivist Meetings: Not on file  . Marital Status: Not on file  Intimate Partner Violence:   . Fear of Current or Ex-Partner: Not on file  . Emotionally Abused: Not on file  . Physically Abused: Not on file  . Sexually Abused: Not on file     Family History  Problem Relation Age of Onset  . Heart attack Father      Current Outpatient Medications on File Prior to Visit  Medication Sig Dispense Refill  . carvedilol (COREG) 3.125 MG tablet TAKE 1 TABLET(3.125 MG) BY MOUTH TWICE DAILY WITH A MEAL 30 tablet 0  . clopidogrel (PLAVIX) 75 MG  tablet Take 1 tablet (75 mg total) by mouth daily. 30 tablet 3  . Cyanocobalamin (VITAMIN B 12) 500 MCG TABS Take 1 tablet by mouth daily.    . mirtazapine (REMERON) 15 MG tablet Take 1 tablet (15 mg total) by mouth at bedtime. 30 tablet 0  . omeprazole (PRILOSEC) 20 MG capsule Take 20 mg by mouth daily.    . rosuvastatin (CRESTOR) 20 MG tablet Take 20 mg by mouth daily.    . tamsulosin (FLOMAX) 0.4 MG CAPS capsule Take 0.4 mg by mouth daily.     No current facility-administered medications on file prior to visit.    Cardiovascular studies:  EKG 10/22/2019:  Sinus rhythm 80 bpm. Anteroseptal infarct, old. IVCD.   ST-T changes related to IVCD.   Echocardiogram 02/24/2019: Left ventricle cavity is normal in size. Mild global and severe anterior/anteroseptal hypokinesis. Visual LVEF 35-40%.  Grade II diastolic dysfunction with elevated LAP.  Left atrial cavity is mildly dilated. Mild aortic stenosis. Moderate (Grade III) mitral regurgitation. Mild tricuspid regurgitation.  No evidence of pulmonary hypertension.  Coronary angiography 2016: NSTEMI PCI to SVG-PDA for severe ISR Resolute Integrity 3.5X38 mm & 3.5X22 mm DES, post dilated to 4.0 mm  Staged PCI on 06/25 & 06/28: PCI to [place] 2.25 X 12 mm DES LM Bifurcation stenting: Xience Expedition 2.5X12 mm, 2.5 X 15 mm, and 3.5 X 8 m DES, complicated by stent dislodgement and non-deployement in left main trunk. LM Xience Expedition 4.0 X 12 mm DES jailing the dislodged stent against legt main trunk wall. Post dilatation with 2.5 kissing balloons, and 4.5 mm balloon in left main. PCI to [place] 3.5X 12 mm DES  Recent labs: 11/10/2019: Glucose 97.  BUN/creatinine 11/1.36.  EGFR 52.  Sodium 137, potassium 3.9.  11/07/2019: Glucose 84.  BUN/creatinine 14/1.66.  EGFR 41.  Sodium 136, potassium 2.3. H/H 13.5/30.8.  MCV 98.  Platelets 131. Lactic acid 1.7  11/06/2019: Lactic acid 2.5    Review of  Systems  Constitution: Negative for decreased appetite, malaise/fatigue, weight gain and weight loss.  HENT: Negative for congestion.   Eyes: Negative for visual disturbance.  Cardiovascular: Negative for chest pain, dyspnea on exertion, leg swelling, palpitations and syncope.  Respiratory: Negative for cough and shortness of breath.   Endocrine: Negative for cold intolerance.  Hematologic/Lymphatic: Does not bruise/bleed easily.  Skin: Negative for itching and rash.  Musculoskeletal: Negative for myalgias.  Gastrointestinal: Negative for abdominal pain, nausea and vomiting.  Genitourinary: Negative for dysuria.  Neurological: Negative for dizziness and weakness.  Psychiatric/Behavioral: The patient is not nervous/anxious.   All other systems reviewed and are negative.        Vitals:   12/11/19 1346  BP: 124/66  Pulse: 94  Temp: (!) 97.1 F (36.2 C)  SpO2: 97%     Body mass index is 22.92 kg/m. Filed Weights   12/11/19 1346  Weight: 155 lb 3.2 oz (70.4 kg)     Objective:   Physical Exam  Constitutional: He is oriented to person, place, and time. He appears well-developed. No distress.  General frailty  HENT:  Head: Normocephalic and atraumatic.  Eyes: Pupils are equal, round, and reactive to light. Conjunctivae are normal.  Neck: No JVD present.  Cardiovascular: Normal rate, regular rhythm and intact distal pulses.  Pulmonary/Chest: Effort normal and breath sounds normal. He has no wheezes. He has no rales.  Abdominal: Soft. Bowel sounds are normal. There is no rebound.  Musculoskeletal:        General: No edema (Trace b/l).  Lymphadenopathy:    He has no cervical adenopathy.  Neurological: He is alert and oriented to person, place, and time. No cranial nerve deficit.  Mild left sided weakness, stable   Skin: Skin is warm and dry.  Psychiatric: He has a normal mood and affect.  Nursing note and vitals reviewed.       Assessment & Recommendations:   73  y/o caucasian male with coronary arery disease s/p multiple prior stents, ischemic cardiomyopathy (EF 35-40%), stroke in 2015 with mild residual left sided deficit, hypertension, tobacco abuse.  HFrEF: Ischemic cardiomyopathy, EF 35-40% (01/2019), likely causing moderate MR with cardiomyopathy.  Clinically compensated, NYHA class II symptoms. Minimal leg edema.  Given recent hospital admission (10/2019) with dehydration and hypotension, I would recommend conservative management for now. Continue carvedilol at low dose 3.125 mg bid. Will hold off ACEi/ARNI at this time given advanced age and tenuous renal function. Encouraged improved hydration and nutrition, but cautioned against salt intake.  Coronary artery disease: Multiple prior PCI's. Currently, no angina symptoms. Recommend plavix 75 mg daily, without Aspirin. Continue crestor 20 mg daily.  H/o CVA: Mild left sided neurodeficit.  F/u in 3 months.    Nigel Mormon, MD [facility] [place] Pager: [phone] Office: [phone] If no answer Cell [phone]

## 2019-12-12 ENCOUNTER — Encounter: Payer: Self-pay | Admitting: Cardiology

## 2019-12-24 ENCOUNTER — Other Ambulatory Visit: Payer: Self-pay | Admitting: Cardiology

## 2019-12-24 DIAGNOSIS — I5020 Unspecified systolic (congestive) heart failure: Secondary | ICD-10-CM

## 2019-12-28 ENCOUNTER — Other Ambulatory Visit: Payer: Self-pay

## 2020-01-12 ENCOUNTER — Other Ambulatory Visit: Payer: Self-pay | Admitting: Cardiology

## 2020-01-12 DIAGNOSIS — I5020 Unspecified systolic (congestive) heart failure: Secondary | ICD-10-CM

## 2020-02-22 ENCOUNTER — Other Ambulatory Visit: Payer: Medicare Other

## 2020-02-25 ENCOUNTER — Ambulatory Visit: Payer: Medicare Other

## 2020-02-25 ENCOUNTER — Other Ambulatory Visit: Payer: Self-pay

## 2020-02-25 DIAGNOSIS — I5020 Unspecified systolic (congestive) heart failure: Secondary | ICD-10-CM

## 2020-03-04 NOTE — Progress Notes (Signed)
Ok to do VV to begin with. See if VV can be done on Wed afternoon. If not, Thursday is fine.

## 2020-03-04 NOTE — Progress Notes (Signed)
Next week is fine. I see he is now scheduled to see me on 3/11.  Thanks MJP

## 2020-03-09 ENCOUNTER — Other Ambulatory Visit: Payer: Self-pay | Admitting: Cardiology

## 2020-03-09 DIAGNOSIS — I5020 Unspecified systolic (congestive) heart failure: Secondary | ICD-10-CM

## 2020-03-10 ENCOUNTER — Encounter: Payer: Self-pay | Admitting: Cardiology

## 2020-03-10 ENCOUNTER — Other Ambulatory Visit: Payer: Self-pay

## 2020-03-10 ENCOUNTER — Telehealth: Payer: Medicare Other | Admitting: Cardiology

## 2020-03-10 VITALS — BP 143/78 | HR 88 | Ht 68.0 in | Wt 167.0 lb

## 2020-03-10 DIAGNOSIS — I5020 Unspecified systolic (congestive) heart failure: Secondary | ICD-10-CM

## 2020-03-10 DIAGNOSIS — I2510 Atherosclerotic heart disease of native coronary artery without angina pectoris: Secondary | ICD-10-CM

## 2020-03-10 DIAGNOSIS — Z8673 Personal history of transient ischemic attack (TIA), and cerebral infarction without residual deficits: Secondary | ICD-10-CM | POA: Insufficient documentation

## 2020-03-10 NOTE — Progress Notes (Signed)
Follow up visit  Subjective:   Anthony Richmond, male    DOB: 04-Nov-1946, 74 y.o.   MRN: [id number]  I connected with the patient on 03/10/2020 by a telephone call and verified that I am speaking with the correct person using two identifiers.     I offered the patient a video enabled application for a virtual visit. Unfortunately, this could not be accomplished due to technical difficulties/lack of video enabled phone/computer. I discussed the limitations of evaluation and management by telemedicine and the availability of in person appointments. The patient expressed understanding and agreed to proceed.   This visit type was conducted due to national recommendations for restrictions regarding the COVID-19 Pandemic (e.g. social distancing).  This format is felt to be most appropriate for this patient at this time.  All issues noted in this document were discussed and addressed.  No physical exam was performed (except for noted visual exam findings with Tele health visits).  The patient has consented to conduct a Tele health visit and understands insurance will be billed.   Chief Complaint  Patient presents with  . HFrEF  . Follow-up  . Results    echo    74 y/o caucasian male with coronary arery disease s/p multiple prior stents, ischemic cardiomyopathy (EF 35-40%), stroke in 2015 with mild residual left sided deficit, hypertension.  Patient's son Anthony Richmond joined over a conference call.   On 10/22, I recommended stopping lisinopril and starting Entresto at low-dose 24/26 mg twice daily 2 days later. Patient was admitted to [facility] between 11/06/2019-11/10/2019 with complaints of dizziness.  Patient was found to be hypotensive with systolic blood pressure in the 60s and hypoglycemia with blood sugar in 40s.  Sepsis work-up was unremarkable.  He did have acute kidney injury with creatinine increased to 1.7, and potassium down to as low as 2.3.  His presentation was thought to be  secondary to dehydration and failure to thrive.  On discharge, his creatinine and potassium had improved to 1.36, and 3.9 respectively.  Carvedilol dose was decreased to 3.125 mg twice daily.[name]  He has since increased his dietary intake and generally feeling well. He denies any exertional dyspnea. However, recent echocardiogram showed LVEF down from 35-40% to 15-20%.    Current Outpatient Medications on File Prior to Visit  Medication Sig Dispense Refill  . carvedilol (COREG) 3.125 MG tablet TAKE 1 TABLET(3.125 MG) BY MOUTH TWICE DAILY WITH A MEAL 120 tablet 1  . clopidogrel (PLAVIX) 75 MG tablet TAKE 1 TABLET(75 MG) BY MOUTH DAILY 90 tablet 1  . Cyanocobalamin (VITAMIN B 12) 500 MCG TABS Take 1 tablet by mouth daily.    . furosemide (LASIX) 20 MG tablet Take 1 tablet (20 mg total) by mouth daily as needed. (Patient taking differently: Take 20 mg by mouth every other day. ) 60 tablet 3  . mirtazapine (REMERON) 15 MG tablet Take 1 tablet (15 mg total) by mouth at bedtime. 30 tablet 0  . omeprazole (PRILOSEC) 20 MG capsule Take 20 mg by mouth daily.    . rosuvastatin (CRESTOR) 20 MG tablet Take 20 mg by mouth daily.    . tamsulosin (FLOMAX) 0.4 MG CAPS capsule Take 0.4 mg by mouth daily.    [name] aspirin EC 81 MG tablet Take 81 mg by mouth daily.     No current facility-administered medications on file prior to visit.    Cardiovascular studies:  Echocardiogram 02/25/2020:  Left ventricle cavity is moderately dilated. Normal left  ventricular wall  thickness. Severe global hypokinesis, anteroseptal akinesis. EF 47-65%  Diastolic function not assessed due to severity of mitral regurgitation.   Left atrial cavity is severely dilated.  Trileaflet aortic valve with mild aortic valve leaflet calcification.  Trace aortic valve stenosis. No regurgitation.  Moderate-severe, posteriorly directed mitral regurgitation.  Mild tricuspid regurgitation. Estimated RA-RV gradient 19 mmHg.  Compared to previous  study in 01/2019, LVEF is reduced from 35-40% to  15-20%.   EKG 10/22/2019:  Sinus rhythm 80 bpm. Anteroseptal infarct, old. IVCD.   ST-T changes related to IVCD.   Echocardiogram 02/24/2019: Left ventricle cavity is normal in size. Mild global and severe anterior/anteroseptal hypokinesis. Visual LVEF 35-40%.  Grade II diastolic dysfunction with elevated LAP.  Left atrial cavity is mildly dilated. Mild aortic stenosis. Moderate (Grade III) mitral regurgitation. Mild tricuspid regurgitation.  No evidence of pulmonary hypertension.  Coronary angiography 2016: NSTEMI PCI to SVG-PDA for severe ISR Resolute Integrity 3.5X38 mm & 3.5X22 mm DES, post dilated to 4.0 mm  Staged PCI on 06/25 & 06/28: PCI to [place] 2.25 X 12 mm DES LM Bifurcation stenting: Xience Expedition 2.5X12 mm, 2.5 X 15 mm, and 3.5 X 8 m DES, complicated by stent dislodgement and non-deployement in left main trunk. LM Xience Expedition 4.0 X 12 mm DES jailing the dislodged stent against legt main trunk wall. Post dilatation with 2.5 kissing balloons, and 4.5 mm balloon in left main. PCI to [place] 3.5X 12 mm DES  Recent labs: 11/10/2019: Glucose 97.  BUN/creatinine 11/1.36.  EGFR 52.  Sodium 137, potassium 3.9.  11/07/2019: Glucose 84.  BUN/creatinine 14/1.66.  EGFR 41.  Sodium 136, potassium 2.3. H/H 13.5/30.8.  MCV 98.  Platelets 131. Lactic acid 1.7  11/06/2019: Lactic acid 2.5    Review of Systems  Constitution: Positive for weight gain (Intentional). Negative for decreased appetite, malaise/fatigue and weight loss.  HENT: Negative for congestion.   Eyes: Negative for visual disturbance.  Cardiovascular: Negative for chest pain, dyspnea on exertion, leg swelling, palpitations and syncope.  Respiratory: Negative for cough and shortness of breath.   Endocrine: Negative for cold intolerance.  Hematologic/Lymphatic: Does not bruise/bleed easily.  Skin: Negative for itching  and rash.  Musculoskeletal: Negative for myalgias.  Gastrointestinal: Negative for abdominal pain, nausea and vomiting.  Genitourinary: Negative for dysuria.  Neurological: Negative for dizziness and weakness.  Psychiatric/Behavioral: The patient is not nervous/anxious.   All other systems reviewed and are negative.        Vitals:     Body mass index is 23.57 kg/m. Filed Weights   03/10/20 0947  Weight: 155 lb (70.3 kg)     Objective:   Physical Exam   Not performed. Telephone visit.     Assessment & Recommendations:   74 y/o caucasian male with coronary arery disease s/p multiple prior stents, ischemic cardiomyopathy (EF 15-20%), stroke in 2015 with mild residual left sided deficit, hypertension.  HFrEF: Ischemic cardiomyopathy, EF reduced from 35-40% to 15-40% (02/2020). This coincides with him coming iff Entresto due to AKI. I will check BMP and assess if Delene Loll can be reintroduced cautiously. Overall, both patient and son echoed that they would like to continue conservative medical management.  Continue carvedilol at low dose 3.125 mg bid.  F/u after BMP for in person physical exam.  Coronary artery disease: Multiple prior PCI's. Currently, no angina symptoms. Recommend plavix 75 mg daily, without Aspirin. Continue crestor 20 mg daily.  H/o CVA: Mild left sided neurodeficit.  Kydan Shanholtzer J Kaleisha Bhargava, MD [facility] Pager: [phone] Office: [phone] If no answer Cell [phone]   

## 2020-03-16 ENCOUNTER — Other Ambulatory Visit (HOSPITAL_COMMUNITY): Payer: Self-pay | Admitting: Cardiology

## 2020-03-16 NOTE — Addendum Note (Signed)
Addended by: Elder Negus on: 03/16/2020 11:59 AM   Modules accepted: Orders

## 2020-03-17 LAB — BASIC METABOLIC PANEL
BUN/Creatinine Ratio: 8 — ABNORMAL LOW (ref 10–24)
BUN: 9 mg/dL (ref 8–27)
CO2: 24 mmol/L (ref 20–29)
Calcium: 9.5 mg/dL (ref 8.6–10.2)
Chloride: 94 mmol/L — ABNORMAL LOW (ref 96–106)
Creatinine, Ser: 1.19 mg/dL (ref 0.76–1.27)
GFR calc Af Amer: 70 mL/min/{1.73_m2} (ref >59)
GFR calc non Af Amer: 60 mL/min/{1.73_m2} (ref >59)
Glucose: 94 mg/dL (ref 65–99)
Potassium: 5.3 mmol/L — ABNORMAL HIGH (ref 3.5–5.2)
Sodium: 135 mmol/L (ref 134–144)

## 2020-03-18 ENCOUNTER — Ambulatory Visit: Payer: Medicare Other | Admitting: Cardiology

## 2020-03-18 ENCOUNTER — Other Ambulatory Visit: Payer: Self-pay | Admitting: Cardiology

## 2020-03-18 ENCOUNTER — Other Ambulatory Visit: Payer: Self-pay

## 2020-03-18 ENCOUNTER — Encounter: Payer: Self-pay | Admitting: Cardiology

## 2020-03-18 VITALS — BP 129/83 | HR 81 | Temp 95.4°F | Ht 68.0 in | Wt 167.0 lb

## 2020-03-18 DIAGNOSIS — Z8673 Personal history of transient ischemic attack (TIA), and cerebral infarction without residual deficits: Secondary | ICD-10-CM

## 2020-03-18 DIAGNOSIS — I2510 Atherosclerotic heart disease of native coronary artery without angina pectoris: Secondary | ICD-10-CM

## 2020-03-18 DIAGNOSIS — I5020 Unspecified systolic (congestive) heart failure: Secondary | ICD-10-CM

## 2020-03-18 DIAGNOSIS — R600 Localized edema: Secondary | ICD-10-CM | POA: Insufficient documentation

## 2020-03-18 DIAGNOSIS — R627 Adult failure to thrive: Secondary | ICD-10-CM

## 2020-03-18 MED ORDER — FUROSEMIDE 20 MG PO TABS: 20.0000 mg | ORAL_TABLET | Freq: Every day | ORAL | 3 refills | Status: DC

## 2020-03-18 NOTE — Progress Notes (Signed)
Follow up visit  Subjective:   Anthony Richmond, male    DOB: [birth year], 74 y.o.   MRN: [id number]   Chief Complaint  Patient presents with  . Congestive Heart Failure    follow up    74 y/o caucasian male with coronary arery disease s/p multiple prior stents, ischemic cardiomyopathy (EF 35-40%), stroke in 2015 with mild residual left sided deficit, hypertension.  Patient's son [name] joined over a conference call.   On 10/22, I recommended stopping lisinopril and starting Entresto at low-dose 24/26 mg twice daily 2 days later. Patient was admitted to [facility] between 11/06/2019-11/10/2019 with complaints of dizziness.  Patient was found to be hypotensive with systolic blood pressure in the 60s and hypoglycemia with blood sugar in 40s.  Sepsis work-up was unremarkable.  He did have acute kidney injury with creatinine increased to 1.7, and potassium down to as low as 2.3.  His presentation was thought to be secondary to dehydration and failure to thrive.  On discharge, his creatinine and potassium had improved to 1.36, and 3.9 respectively.  Carvedilol dose was decreased to 3.125 mg twice daily.[name]  He has since increased his dietary intake and generally feeling well. He denies any exertional dyspnea. However, recent echocardiogram showed LVEF down from 35-40% to 15-20%. Patient lives in independent living facility. His day to day activity includes walking with a rolling walker, mostly inside his apartment. With his very limited physical activity, he does not have any significant shortness of breath or chest pain. He denies orthopnea, PND, leg edema.   Overall, he has had slow decline in his functional capacity over the last couple of years.     Current Outpatient Medications on File Prior to Visit  Medication Sig Dispense Refill  . carvedilol (COREG) 3.125 MG tablet TAKE 1 TABLET(3.125 MG) BY MOUTH TWICE DAILY WITH A MEAL 120 tablet 1  . clopidogrel (PLAVIX) 75 MG tablet TAKE  1 TABLET(75 MG) BY MOUTH DAILY 90 tablet 1  . Cyanocobalamin (VITAMIN B 12) 500 MCG TABS Take 1 tablet by mouth daily.    . furosemide (LASIX) 20 MG tablet Take 1 tablet (20 mg total) by mouth daily as needed. (Patient taking differently: Take 20 mg by mouth every other day. ) 60 tablet 3  . mirtazapine (REMERON) 15 MG tablet Take 1 tablet (15 mg total) by mouth at bedtime. 30 tablet 0  . omeprazole (PRILOSEC) 20 MG capsule Take 20 mg by mouth daily.    . rosuvastatin (CRESTOR) 20 MG tablet Take 20 mg by mouth daily.    . tamsulosin (FLOMAX) 0.4 MG CAPS capsule Take 0.4 mg by mouth daily.     No current facility-administered medications on file prior to visit.    Cardiovascular studies:  Echocardiogram 02/25/2020:  Left ventricle cavity is moderately dilated. Normal left ventricular wall  thickness. Severe global hypokinesis, anteroseptal akinesis. EF 79-89%  Diastolic function not assessed due to severity of mitral regurgitation.   Left atrial cavity is severely dilated.  Trileaflet aortic valve with mild aortic valve leaflet calcification.  Trace aortic valve stenosis. No regurgitation.  Moderate-severe, posteriorly directed mitral regurgitation.  Mild tricuspid regurgitation. Estimated RA-RV gradient 19 mmHg.  Compared to previous study in 01/2019, LVEF is reduced from 35-40% to  15-20%.   EKG 10/22/2019:  Sinus rhythm 80 bpm. Anteroseptal infarct, old. IVCD.   ST-T changes related to IVCD.   Coronary angiography 2016: NSTEMI PCI to SVG-PDA for severe ISR Resolute Integrity 3.5X38 mm &  3.5X22 mm DES, post dilated to 4.0 mm  Staged PCI on 06/25 & 06/28: PCI to [place] 2.25 X 12 mm DES LM Bifurcation stenting: Xience Expedition 2.5X12 mm, 2.5 X 15 mm, and 3.5 X 8 m DES, complicated by stent dislodgement and non-deployement in left main trunk. LM Xience Expedition 4.0 X 12 mm DES jailing the dislodged stent against legt main trunk wall. Post dilatation with 2.5  kissing balloons, and 4.5 mm balloon in left main. PCI to [place] 3.5X 12 mm DES  Recent labs: 03/16/2020: Glucose 94, BUN/Cr 9/1.19. EGFR 60. Na/K 135/5.3.   11/10/2019: Glucose 97.  BUN/creatinine 11/1.36.  EGFR 52.  Sodium 137, potassium 3.9.  11/07/2019: Glucose 84.  BUN/creatinine 14/1.66.  EGFR 41.  Sodium 136, potassium 2.3. H/H 13.5/30.8.  MCV 98.  Platelets 131. Lactic acid 1.7  11/06/2019: Lactic acid 2.5    Review of Systems  Constitution: Positive for weight gain (Intentional). Negative for decreased appetite, malaise/fatigue and weight loss.  Cardiovascular: Negative for chest pain, dyspnea on exertion, leg swelling, palpitations and syncope.  Respiratory: Negative for shortness of breath.   All other systems reviewed and are negative.        Vitals:   03/18/20 1415  BP: 129/83  Pulse: 81  Temp: (!) 95.4 F (35.2 C)  SpO2: 100%     Body mass index is 25.39 kg/m. Filed Weights   03/18/20 1415  Weight: 167 lb (75.8 kg)     Objective:   Physical Exam  Constitutional: He appears well-developed and well-nourished.  Neck: No JVD present.  Cardiovascular: Normal rate and regular rhythm. Exam reveals decreased pulses.  Murmur heard. High-pitched blowing holosystolic murmur is present with a grade of 3/6 at the apex. Pulmonary/Chest: Effort normal and breath sounds normal. He has no wheezes. He has no rales.  Musculoskeletal:        General: Edema (1+ b/l) present.  Skin: Skin is warm and dry.  Nursing note and vitals reviewed.      Assessment & Recommendations:   74 y/o caucasian male with coronary arery disease s/p multiple prior stents, ischemic cardiomyopathy (EF 15-20%), stroke in 2015 with mild residual left sided deficit, hypertension.  HFrEF: Ischemic cardiomyopathy, EF reduced from 35-40% to 15-40% (02/2020). Clinically, he is in mild volume overload, without any symptoms. Although, I believe this is primarily due to his  limited baseline functional capacity.  We (patient, his son [name], and I) had a long discussion today regarding his long term prognosis, goals, and expectations. Patient is clear about not wanting any aggressive invasive workup and management. He would like to be "DNR" status. Patient has an advanced directive, and I have asked him to provide [place] a copy.   With his hyperkalemia, I do not recommend starting Entresto. Will start on low dose lasix. Will check BMP and BNP in 3 weeks. Recommend regular daily weight checks.   Coronary artery disease: Multiple prior PCI's. Currently, no angina symptoms. Recommend plavix 75 mg daily, without Aspirin. Continue crestor 20 mg daily.  H/o CVA: Mild left sided neurodeficit.  Patient is going to spend his spring/summer in [place], where is has established cardiologist. I will fax my notes to them.   Nigel Mormon, MD [facility] Pager: [phone] Office: [phone] If no answer Cell [phone]

## 2020-05-11 ENCOUNTER — Other Ambulatory Visit: Payer: Self-pay | Admitting: Cardiology

## 2020-05-11 DIAGNOSIS — I5020 Unspecified systolic (congestive) heart failure: Secondary | ICD-10-CM

## 2020-07-13 IMAGING — CT CT SHOULDER*L* W/CM
1 series · 12 of 14 positions shown, 15 images · non-contrast
Comparison: Plain films left shoulder 12/11/2018.

CLINICAL DATA: History of a fall 10/06/2018 resulting in a surgical
neck fracture of the left shoulder. Continued pain and limited range
of motion. Subsequent encounter.

EXAM:
CT ARTHROGRAPHY OF THE LEFT SHOULDER
TECHNIQUE: Multidetector CT imaging was performed following the standard
protocol after injection of dilute contrast into the joint.

[Series 4: shoulder 2.0 b41s · axial · 0.51mm/px · z∈[-229,-61]mm · 12 of 100 slices shown, 15 images]
[im 8/100  soft-tissue]
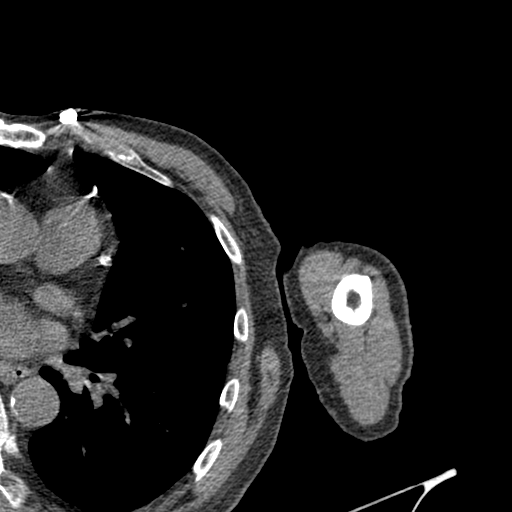
[im 8/100  bone]
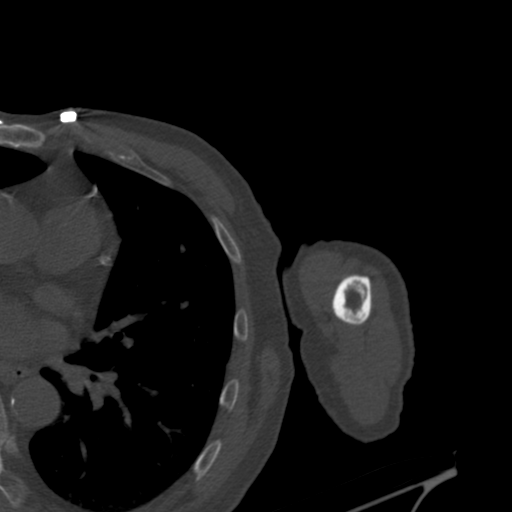
[im 16/100  bone]
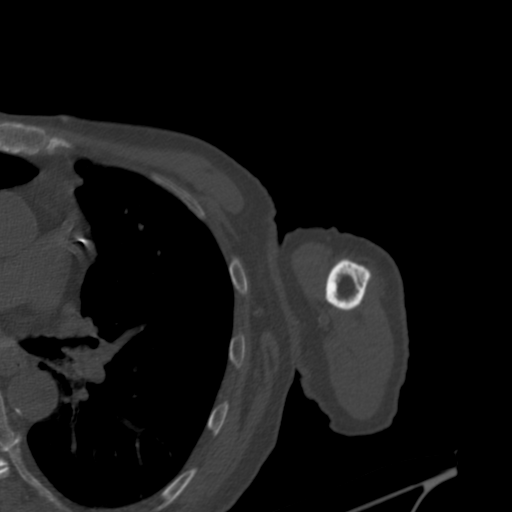
[im 23/100  bone]
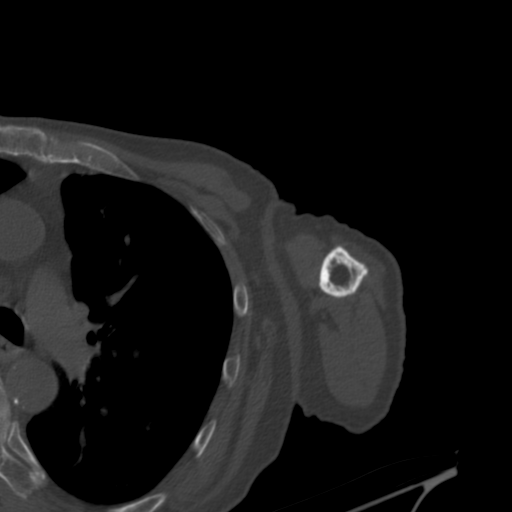
[im 31/100  bone]
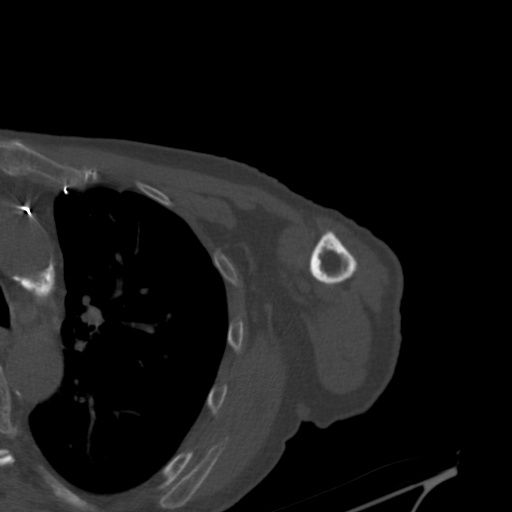
[im 39/100  soft-tissue]
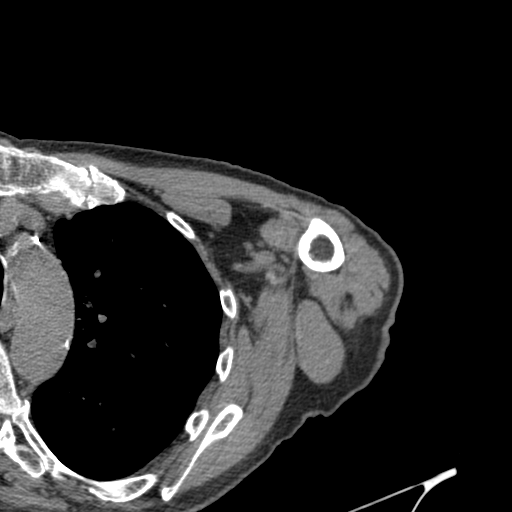
[im 39/100  bone]
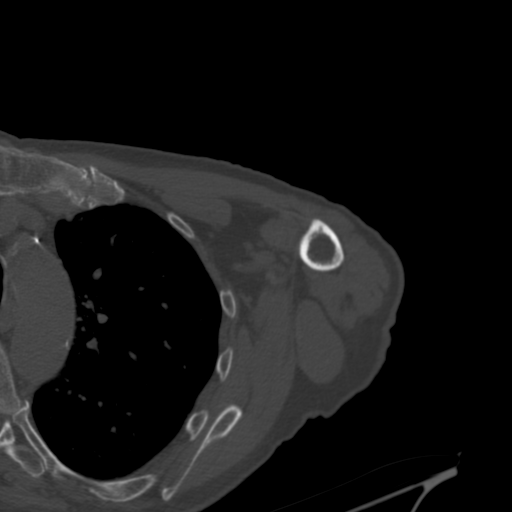
[im 46/100  bone]
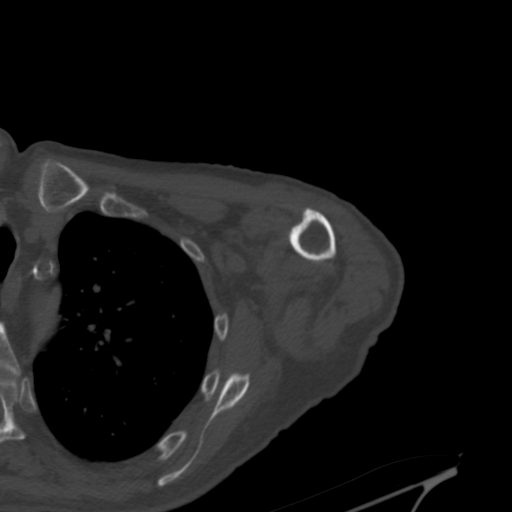
[im 54/100  bone]
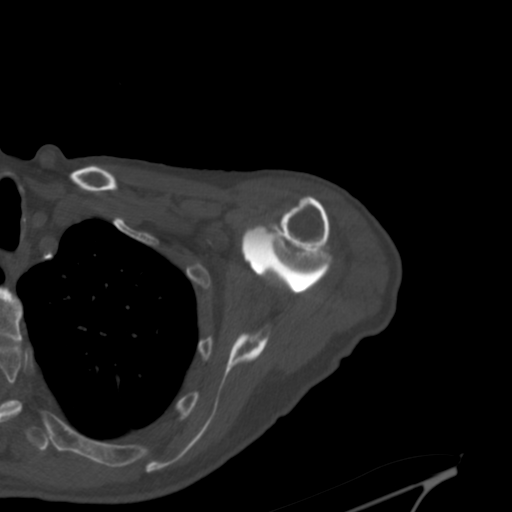
[im 61/100  bone]
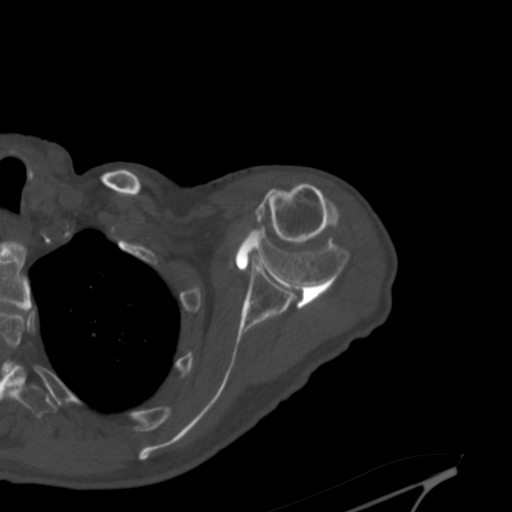
[im 69/100  soft-tissue]
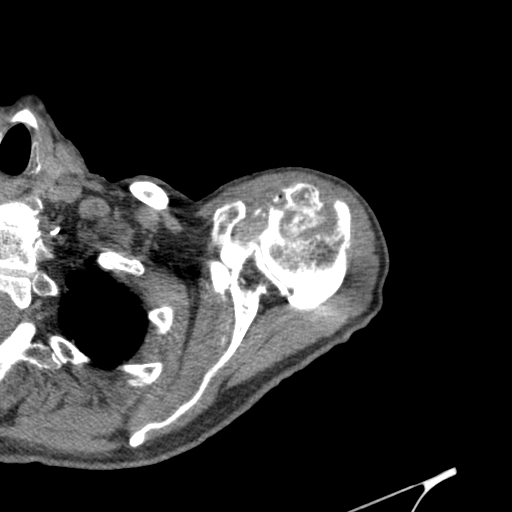
[im 69/100  bone]
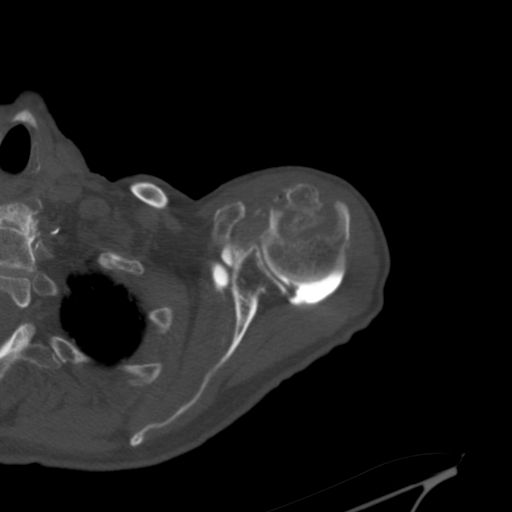
[im 77/100  bone]
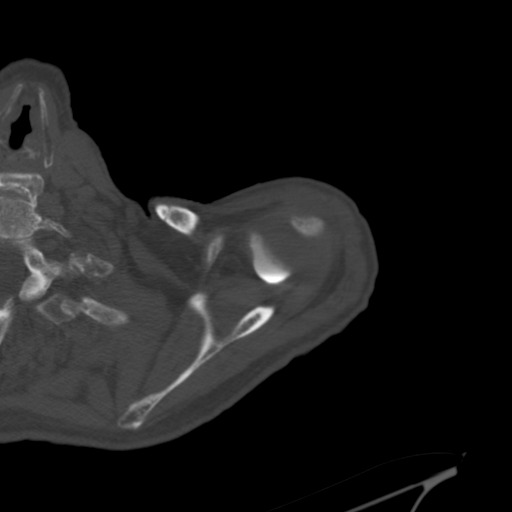
[im 84/100  bone]
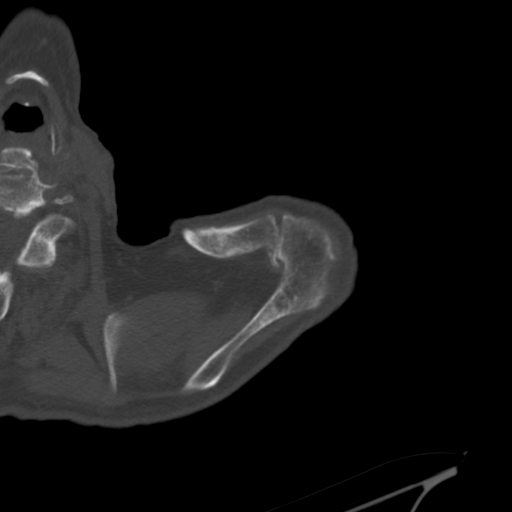
[im 92/100  bone]
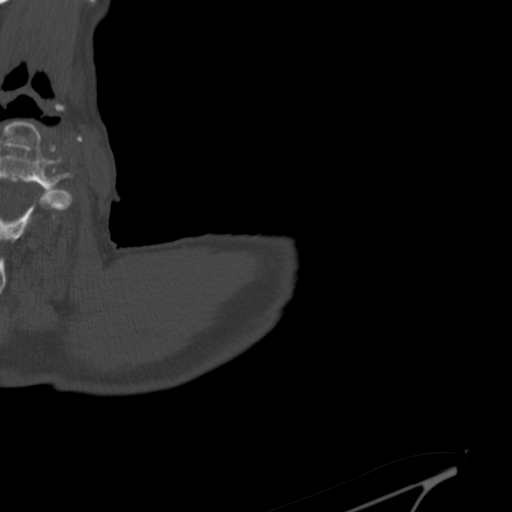

[12 of 14 positions shown; findings below may reference images not displayed]

FINDINGS: Rotator cuff: The rotator cuff tendons appear thickened consistent
with tendinosis but no tear is identified. Contrast within the
substance of the subscapularis is likely related to contrast
injection.

Muscles:  No atrophy or focal lesion.

Biceps long head: The tendon is difficult to visualize but likely
intact.

Acromioclavicular Joint: Moderate osteoarthritis is seen. Type 1
acromion. Mild subacromial spurring is noted. No contrast
extravasates into the subacromial/subdeltoid bursa. A small amount
of fluid is seen in the bursa.

Glenohumeral Joint: Mild degenerative change is present with some
cartilage thinning and osteophytosis along the anterior rim of the
glenoid.

Labrum:  Appears intact.

Bones: The patient has a transverse fracture of the surgical neck of
the humerus. The humeral head is rotated medially by retraction from
the rotator cuff. The shaft of the humerus is anteriorly displaced
approximately [DATE] shaft width. There is fragment override of 2-3 cm.
Fracture margins are corticated consistent with nonunion. The
fracture includes an incomplete component through the lesser
tuberosity. The greater tuberosity is spared.

Other: None.
IMPRESSION: Impacted and displaced surgical neck fracture of the left humerus is
a nonunion. Incomplete component of the fracture through the lesser
tuberosity is identified. The greater tuberosity is spared.

The rotator cuff and long head of biceps appear intact. Tendinopathy
is noted.

Moderate acromioclavicular and mild glenohumeral osteoarthritis.

## 2020-09-11 ENCOUNTER — Other Ambulatory Visit: Payer: Self-pay | Admitting: Cardiology

## 2020-09-11 DIAGNOSIS — I5020 Unspecified systolic (congestive) heart failure: Secondary | ICD-10-CM

## 2020-11-21 ENCOUNTER — Other Ambulatory Visit: Payer: Self-pay | Admitting: Cardiology

## 2020-11-21 DIAGNOSIS — I5020 Unspecified systolic (congestive) heart failure: Secondary | ICD-10-CM

## 2021-03-31 DEATH — deceased

## 2021-04-10 ENCOUNTER — Other Ambulatory Visit: Payer: Self-pay | Admitting: Cardiology

## 2021-04-10 DIAGNOSIS — R600 Localized edema: Secondary | ICD-10-CM

## 2021-04-10 DIAGNOSIS — I5020 Unspecified systolic (congestive) heart failure: Secondary | ICD-10-CM
